# Patient Record
Sex: Male | Born: 1985 | Race: Black or African American | Hispanic: No | Marital: Single | Smoking: Current some day smoker
Health system: Southern US, Community
[De-identification: ages and names within clinical notes are randomized; demographics above are authoritative.]

## PROBLEM LIST (undated history)

## (undated) DIAGNOSIS — T4145XA Adverse effect of unspecified anesthetic, initial encounter: Secondary | ICD-10-CM

## (undated) DIAGNOSIS — T8859XA Other complications of anesthesia, initial encounter: Secondary | ICD-10-CM

## (undated) DIAGNOSIS — D573 Sickle-cell trait: Secondary | ICD-10-CM

---
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
## 2012-04-07 ENCOUNTER — Emergency Department (INDEPENDENT_AMBULATORY_CARE_PROVIDER_SITE_OTHER)
Admission: EM | Admit: 2012-04-07 | Discharge: 2012-04-07 | Disposition: A | Discharge status: Home / Self Care | Payer: Self-pay | Source: Home / Self Care | Attending: Family Medicine | Admitting: Family Medicine

## 2012-04-07 ENCOUNTER — Encounter (HOSPITAL_COMMUNITY): Payer: Self-pay | Admitting: Emergency Medicine

## 2012-04-07 DIAGNOSIS — Z2089 Contact with and (suspected) exposure to other communicable diseases: Secondary | ICD-10-CM

## 2012-04-07 DIAGNOSIS — Z202 Contact with and (suspected) exposure to infections with a predominantly sexual mode of transmission: Secondary | ICD-10-CM

## 2012-04-07 MED ORDER — METRONIDAZOLE 250 MG PO TABS: 250.0000 mg | ORAL_TABLET | Freq: Three times a day (TID) | ORAL | Status: AC

## 2012-04-07 NOTE — ED Provider Notes (Signed)
History     CSN: [id number]  Arrival date & time 04/07/12  1604   First MD Initiated Contact with Patient 04/07/12 1622      Chief Complaint  Patient presents with  . Exposure to STD    (Consider location/radiation/quality/duration/timing/severity/associated sxs/prior treatment) Patient is a 26 y.o. male presenting with STD exposure. The history is provided by the patient.  Exposure to STD This is a new problem. The current episode started yesterday (girlfriend found yest that she has trichomonas, pt with no sx.). The problem has not changed since onset.The symptoms are aggravated by nothing.    History reviewed. No pertinent past medical history.  History reviewed. No pertinent past surgical history.  No family history on file.  History  Substance Use Topics  . Smoking status: Current Everyday Smoker  . Smokeless tobacco: Not on file  . Alcohol Use: Yes      Review of Systems  Constitutional: Negative.   Gastrointestinal: Negative.   Genitourinary: Negative.   Skin: Negative for rash.    Allergies  Review of patient's allergies indicates no known allergies.  Home Medications   Current Outpatient Rx  Name Route Sig Dispense Refill  . METRONIDAZOLE 250 MG PO TABS Oral Take 1 tablet (250 mg total) by mouth 3 (three) times daily. 21 tablet 0    There were no vitals taken for this visit.  Physical Exam  Nursing note and vitals reviewed. Constitutional: He is oriented to person, place, and time. He appears well-developed and well-nourished.  Abdominal: Soft. Bowel sounds are normal.  Genitourinary: Penis normal. No penile tenderness.  Neurological: He is alert and oriented to person, place, and time.  Skin: Skin is warm and dry.    ED Course  Procedures (including critical care time)   Labs Reviewed  GC/CHLAMYDIA PROBE AMP, GENITAL   No results found.   1. Exposure to STD       MDM          Linna Hoff, MD 04/07/12 1723

## 2012-04-07 NOTE — Discharge Instructions (Signed)
Use medicine as prescribed, we will call if tests show a need for other treatment.  

## 2012-04-07 NOTE — ED Notes (Signed)
PT HERE FOR TREATMENT OF TRICHOMONAS AFTER GIRLFRIEND POSITIVE YESTERDAY.PT DENIES PENILE D/C OR PAIN.

## 2012-05-05 ENCOUNTER — Encounter (HOSPITAL_COMMUNITY): Payer: Self-pay | Admitting: Emergency Medicine

## 2012-05-05 ENCOUNTER — Emergency Department (HOSPITAL_COMMUNITY)
Admission: EM | Admit: 2012-05-05 | Discharge: 2012-05-05 | Disposition: A | Discharge status: Home / Self Care | Payer: Self-pay | Attending: Emergency Medicine | Admitting: Emergency Medicine

## 2012-05-05 DIAGNOSIS — L0291 Cutaneous abscess, unspecified: Secondary | ICD-10-CM

## 2012-05-05 DIAGNOSIS — L723 Sebaceous cyst: Secondary | ICD-10-CM | POA: Insufficient documentation

## 2012-05-05 DIAGNOSIS — L03211 Cellulitis of face: Secondary | ICD-10-CM | POA: Insufficient documentation

## 2012-05-05 DIAGNOSIS — L0201 Cutaneous abscess of face: Secondary | ICD-10-CM | POA: Insufficient documentation

## 2012-05-05 DIAGNOSIS — F17200 Nicotine dependence, unspecified, uncomplicated: Secondary | ICD-10-CM | POA: Insufficient documentation

## 2012-05-05 NOTE — ED Notes (Signed)
Pt c/o abscess to right side of head with some swelling around right eye x 5 days

## 2012-05-05 NOTE — Discharge Instructions (Signed)
Abscess An abscess (boil or furuncle) is an infected area under your skin. This area is filled with yellowish white fluid (pus). HOME CARE   Only take medicine as told by your doctor.   Keep the skin clean around your abscess. Keep clothes that may touch the abscess clean.   Change any bandages (dressings) as told by your doctor.   Avoid direct skin contact with other people. The infection can spread by skin contact with others.   Practice good hygiene and do not share personal care items.   Do not share athletic equipment, towels, or whirlpools. Shower after every practice or work out session.   If a draining area cannot be covered:   Do not play sports.   Children should not go to daycare until the wound has healed or until fluid (drainage) stops coming out of the wound.   See your doctor for a follow-up visit as told.  GET HELP RIGHT AWAY IF:   There is more pain, puffiness (swelling), and redness in the wound site.   There is fluid or bleeding from the wound site.   You have muscle aches, chills, fever, or feel sick.   You or your child has a temperature by mouth above 102 F (38.9 C), not controlled by medicine.   Your baby is older than 3 months with a rectal temperature of 102 F (38.9 C) or higher.  MAKE SURE YOU:   Understand these instructions.   Will watch your condition.   Will get help right away if you are not doing well or get worse.  Document Released: 05/14/2008 Document Revised: 11/15/2011 Document Reviewed: 05/14/2008 Exit[employer] Patient Information 2012 [employer]. 

## 2012-05-05 NOTE — ED Provider Notes (Addendum)
History   This chart was scribed for Gwyneth Sprout, MD by Brooks Sailors. The patient was seen in room STRE6/STRE6. Patient's care was started at 1344.   CSN: [id number]  Arrival date & time 05/05/12  1344   First MD Initiated Contact with Patient 05/05/12 1413      Chief Complaint  Patient presents with  . Abscess    (Consider location/radiation/quality/duration/timing/severity/associated sxs/prior treatment) HPI  [name] is a 26 y.o. male who presents to the Emergency Department complaining of a bump on the right ear which is tender and has swelling that goes around the right hear and near the eye. Denies eye pain and ear pain. Symptoms have been persistent for 5 days. Denies neck pain, jaw pain. History provided by patient and patients mother   History reviewed. No pertinent past medical history.  History reviewed. No pertinent past surgical history.  History reviewed. No pertinent family history.  History  Substance Use Topics  . Smoking status: Current Everyday Smoker  . Smokeless tobacco: Not on file  . Alcohol Use: Yes      Review of Systems  All other systems reviewed and are negative.    Allergies  Review of patient's allergies indicates no known allergies.  Home Medications  No current outpatient prescriptions on file.  BP 106/67  Pulse 69  Temp(Src) 98.4 F (36.9 C) (Oral)  Resp 20  SpO2 99%  Physical Exam  Nursing note and vitals reviewed. Constitutional: He is oriented to person, place, and time. He appears well-developed and well-nourished. No distress.  HENT:  Head: Normocephalic and atraumatic.  Left Ear: External ear normal.       Sebaceous pore right anterior to his right ear. Expresses some purulent material, Quarter sized fluctuant, swollen area over the area of his right TMJ. Mild edema under the right eye. Normal jaw movement normal eye function.  Eyes: EOM are normal.  Neck: Neck supple. No tracheal deviation present.    Abdominal: Soft.  Musculoskeletal: Normal range of motion. He exhibits no tenderness.  Neurological: He is alert and oriented to person, place, and time.  Skin: Skin is warm and dry.  Psychiatric: He has a normal mood and affect. His behavior is normal.    ED Course  Procedures (including critical care time)  Pt seen at 1435.   Labs Reviewed - No data to display No results found.  INCISION AND DRAINAGE Performed by: Gwyneth Sprout Consent: Verbal consent obtained. Risks and benefits: risks, benefits and alternatives were discussed Type: abscess  Body area: right face  Anesthesia: local infiltration  Local anesthetic: lidocaine 1% with epinephrine  Anesthetic total: 3 ml  Complexity: complex Blunt dissection to break up loculations  Drainage: purulent  Drainage amount: large amount of purulent foul smelling drainage  Packing material: none  Patient tolerance: Patient tolerated the procedure well with no immediate complications.    1. Abscess   2. Sebaceous cyst       MDM   Pt with sebaceous abscess of the side of the right face in the hair line.  Large amt of purulent drainage removed and pt d/ced home.  NO signs of cellulitis.      I personally performed the services described in this documentation, which was scribed in my presence.  The recorded information has been reviewed and considered.    Gwyneth Sprout, MD 05/05/12 1541  Gwyneth Sprout, MD 05/05/12 8295

## 2012-05-19 ENCOUNTER — Emergency Department (INDEPENDENT_AMBULATORY_CARE_PROVIDER_SITE_OTHER)
Admission: EM | Admit: 2012-05-19 | Discharge: 2012-05-19 | Disposition: A | Discharge status: Home / Self Care | Payer: Self-pay | Source: Home / Self Care | Attending: Family Medicine | Admitting: Family Medicine

## 2012-05-19 ENCOUNTER — Encounter (HOSPITAL_COMMUNITY): Payer: Self-pay | Admitting: *Deleted

## 2012-05-19 DIAGNOSIS — Z202 Contact with and (suspected) exposure to infections with a predominantly sexual mode of transmission: Secondary | ICD-10-CM

## 2012-05-19 DIAGNOSIS — Z2089 Contact with and (suspected) exposure to other communicable diseases: Secondary | ICD-10-CM

## 2012-05-19 MED ORDER — METRONIDAZOLE 500 MG PO TABS: 500.0000 mg | ORAL_TABLET | Freq: Two times a day (BID) | ORAL | Status: DC

## 2012-05-19 MED ORDER — METRONIDAZOLE 500 MG PO TABS: 500.0000 mg | ORAL_TABLET | Freq: Two times a day (BID) | ORAL | Status: AC

## 2012-05-19 NOTE — ED Provider Notes (Signed)
History     CSN: [id number]  Arrival date & time 05/19/12  1628   First MD Initiated Contact with Patient 05/19/12 1814      Chief Complaint  Patient presents with  . Follow-up    (Consider location/radiation/quality/duration/timing/severity/associated sxs/prior treatment) Patient is a 26 y.o. male presenting with penile discharge. The history is provided by the patient. No language interpreter was used.  Penile Discharge This is a new problem.  Pt reports girlfriend was diagnosed with trich.   Pt sent here for evaluation and treatment.  Past Medical History  Diagnosis Date  . STD (male)     History reviewed. No pertinent past surgical history.  History reviewed. No pertinent family history.  History  Substance Use Topics  . Smoking status: Current Everyday Smoker  . Smokeless tobacco: Not on file  . Alcohol Use: Yes     denies      Review of Systems  Genitourinary: Positive for discharge.  All other systems reviewed and are negative.    Allergies  Review of patient's allergies indicates no known allergies.  Home Medications   Current Outpatient Rx  Name Route Sig Dispense Refill  . METRONIDAZOLE 500 MG PO TABS Oral Take 1 tablet (500 mg total) by mouth 2 (two) times daily. 14 tablet 0    BP 117/74  Pulse 70  Temp(Src) 97.7 F (36.5 C) (Oral)  Resp 18  SpO2 99%  Physical Exam  Nursing note and vitals reviewed. Constitutional: He is oriented to person, place, and time. He appears well-developed and well-nourished.  HENT:  Head: Normocephalic.  Eyes: Conjunctivae and EOM are normal. Pupils are equal, round, and reactive to light.  Neck: Normal range of motion.  Cardiovascular: Normal rate and regular rhythm.   Pulmonary/Chest: He has wheezes.  Abdominal: Soft.  Genitourinary:       Hypospadis, clear discharge  Musculoskeletal: Normal range of motion.  Neurological: He is alert and oriented to person, place, and time. He has normal reflexes.    Skin: Skin is warm.  Psychiatric: He has a normal mood and affect.    ED Course  Procedures (including critical care time)  Labs Reviewed - No data to display No results found.   1. Trichomonas contact       MDM  [place]          Elson Areas, [place] 05/19/12 1828

## 2012-05-19 NOTE — Discharge Instructions (Signed)
Trichomoniasis  Trichomoniasis is an infection, caused by the Trichomonas organism, that affects both women and men. In women, the outer male genitalia and the vagina are affected. In men, the penis is mainly affected, but the prostate and other reproductive organs can also be involved. Trichomoniasis is a sexually transmitted disease (STD) and is most often passed to another person through sexual contact. The majority of people who get trichomoniasis do so from a sexual encounter and are also at risk for other STDs.  CAUSES    Sexual intercourse with an infected partner.   It can be present in swimming pools or hot tubs.  SYMPTOMS    Abnormal gray-green frothy vaginal discharge in women.   Vaginal itching and irritation in women.   Itching and irritation of the area outside the vagina in women.   Penile discharge with or without pain in males.   Inflammation of the urethra (urethritis), causing painful urination.   Bleeding after sexual intercourse.  RELATED COMPLICATIONS   Pelvic inflammatory disease.   Infection of the uterus (endometritis).   Infertility.   Tubal (ectopic) pregnancy.   It can be associated with other STDs, including gonorrhea and chlamydia, hepatitis B, and HIV.  COMPLICATIONS DURING PREGNANCY   Early (premature) delivery.   Premature rupture of the membranes (PROM).   Low birth weight.  DIAGNOSIS    Visualization of Trichomonas under the microscope from the vagina discharge.   Ph of the vagina greater than 4.5, tested with a test tape.   Trich Rapid Test.   Culture of the organism, but this is not usually needed.   It may be found on a Pap test.   Having a "strawberry cervix,"which means the cervix looks very red like a strawberry.  TREATMENT    You may be given medication to fight the infection. Inform your caregiver if you could be or are pregnant. Some medications used to treat the infection should not be taken during pregnancy.   Over-the-counter medications or  creams to decrease itching or irritation may be recommended.   Your sexual partner will need to be treated if infected.  HOME CARE INSTRUCTIONS    Take all medication prescribed by your caregiver.   Take over-the-counter medication for itching or irritation as directed by your caregiver.   Do not have sexual intercourse while you have the infection.   Do not douche or wear tampons.   Discuss your infection with your partner, as your partner may have acquired the infection from you. Or, your partner may have been the person who transmitted the infection to you.   Have your sex partner examined and treated if necessary.   Practice safe, informed, and protected sex.   See your caregiver for other STD testing.  SEEK MEDICAL CARE IF:    You still have symptoms after you finish the medication.   You have an oral temperature above 102 F (38.9 C).   You develop belly (abdominal) pain.   You have pain when you urinate.   You have bleeding after sexual intercourse.   You develop a rash.   The medication makes you sick or makes you throw up (vomit).  Document Released: 05/22/2001 Document Revised: 11/15/2011 Document Reviewed: 06/17/2009  [employer] Patient Information 2012 [employer].

## 2012-05-19 NOTE — ED Notes (Signed)
Pt reports being seen here previously for std and treated. Significant other reports that they had unprotected sex and that she is having "discharge". She also reports that she has not completed antibiotics for treatment of std but does have prescription. Pt and significant other educated on prevention, cause and treatment of disease process.

## 2012-05-20 LAB — GC/CHLAMYDIA PROBE AMP, GENITAL
Chlamydia, DNA Probe: NEGATIVE
GC Probe Amp, Genital: NEGATIVE

## 2012-05-21 NOTE — ED Provider Notes (Signed)
Medical screening examination/treatment/procedure(s) were performed by non-physician practitioner and as supervising physician I was immediately available for consultation/collaboration.   MORENO-COLL,Codi Folkerts; MD   Maya Arcand Moreno-Coll, MD 05/21/12 0732 

## 2012-12-27 ENCOUNTER — Encounter (HOSPITAL_COMMUNITY): Payer: Self-pay | Admitting: Emergency Medicine

## 2012-12-27 ENCOUNTER — Emergency Department (HOSPITAL_COMMUNITY)
Admission: EM | Admit: 2012-12-27 | Discharge: 2012-12-27 | Disposition: A | Discharge status: Home / Self Care | Payer: No Typology Code available for payment source | Attending: Emergency Medicine | Admitting: Emergency Medicine

## 2012-12-27 ENCOUNTER — Emergency Department (HOSPITAL_COMMUNITY): Payer: No Typology Code available for payment source

## 2012-12-27 DIAGNOSIS — IMO0001 Reserved for inherently not codable concepts without codable children: Secondary | ICD-10-CM | POA: Insufficient documentation

## 2012-12-27 DIAGNOSIS — H1130 Conjunctival hemorrhage, unspecified eye: Secondary | ICD-10-CM | POA: Insufficient documentation

## 2012-12-27 DIAGNOSIS — F17200 Nicotine dependence, unspecified, uncomplicated: Secondary | ICD-10-CM | POA: Insufficient documentation

## 2012-12-27 DIAGNOSIS — IMO0002 Reserved for concepts with insufficient information to code with codable children: Secondary | ICD-10-CM | POA: Insufficient documentation

## 2012-12-27 DIAGNOSIS — S0003XA Contusion of scalp, initial encounter: Secondary | ICD-10-CM | POA: Insufficient documentation

## 2012-12-27 DIAGNOSIS — H5710 Ocular pain, unspecified eye: Secondary | ICD-10-CM | POA: Insufficient documentation

## 2012-12-27 DIAGNOSIS — Z79899 Other long term (current) drug therapy: Secondary | ICD-10-CM | POA: Insufficient documentation

## 2012-12-27 DIAGNOSIS — Y9389 Activity, other specified: Secondary | ICD-10-CM | POA: Insufficient documentation

## 2012-12-27 DIAGNOSIS — S1093XA Contusion of unspecified part of neck, initial encounter: Secondary | ICD-10-CM | POA: Insufficient documentation

## 2012-12-27 DIAGNOSIS — H539 Unspecified visual disturbance: Secondary | ICD-10-CM | POA: Insufficient documentation

## 2012-12-27 DIAGNOSIS — S0993XA Unspecified injury of face, initial encounter: Secondary | ICD-10-CM | POA: Insufficient documentation

## 2012-12-27 DIAGNOSIS — T148XXA Other injury of unspecified body region, initial encounter: Secondary | ICD-10-CM

## 2012-12-27 DIAGNOSIS — Y92410 Unspecified street and highway as the place of occurrence of the external cause: Secondary | ICD-10-CM | POA: Insufficient documentation

## 2012-12-27 DIAGNOSIS — Z8619 Personal history of other infectious and parasitic diseases: Secondary | ICD-10-CM | POA: Insufficient documentation

## 2012-12-27 MED ORDER — METHOCARBAMOL 500 MG PO TABS: 500.0000 mg | ORAL_TABLET | Freq: Two times a day (BID) | ORAL | Status: DC

## 2012-12-27 MED ORDER — OXYCODONE-ACETAMINOPHEN 5-325 MG PO TABS
2.0000 | ORAL_TABLET | Freq: Once | ORAL | Status: AC
  Administered 2012-12-27: 2 via ORAL
  Filled 2012-12-27: qty 2

## 2012-12-27 MED ORDER — HYDROCODONE-ACETAMINOPHEN 5-325 MG PO TABS: 2.0000 | ORAL_TABLET | ORAL | Status: DC | PRN

## 2012-12-27 MED ORDER — NAPROXEN 500 MG PO TABS: 500.0000 mg | ORAL_TABLET | Freq: Two times a day (BID) | ORAL | Status: DC

## 2012-12-27 NOTE — ED Notes (Signed)
Pt leaving ED with family. Pt given d/c teaching and prescriptions. Pt educated on importance of follow up with opthalmology as soon as possible. Pt verbalizes understanding and has no further questions upon d/c. Pt does not appear to be in acute distress upon d/c.
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
## 2012-12-27 NOTE — ED Notes (Signed)
Ticket to ride given to pt and plan explained to pt and pts family.

## 2012-12-27 NOTE — ED Notes (Signed)
Hannah M, PA at bedside  

## 2012-12-27 NOTE — ED Notes (Signed)
[employer] at pts bedside speaking with pt

## 2012-12-27 NOTE — ED Notes (Signed)
Pts family states pt tried to open eye and started watering more on attempt to open left eye. Family asked if pt would need an eye patch. Informed family and pt that would speak with EDPA about possible eye patch.

## 2012-12-27 NOTE — ED Notes (Signed)
Cyndia Skeeters, PA states eye patch will not work. Cyndia Skeeters. PA states to do a saline flush on left eye.
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
## 2012-12-27 NOTE — ED Notes (Signed)
Pt able to open eye better after irrigation and able to blink. Pt states eye feels better after irrigation. Pt tolerated procedure well.

## 2012-12-28 NOTE — ED Provider Notes (Signed)
Medical screening examination/treatment/procedure(s) were performed by non-physician practitioner and as supervising physician I was immediately available for consultation/collaboration.   Carleene Cooper III, MD 12/28/12 1322

## 2013-09-04 ENCOUNTER — Encounter (HOSPITAL_COMMUNITY): Payer: Self-pay | Admitting: *Deleted

## 2013-09-04 ENCOUNTER — Emergency Department (HOSPITAL_COMMUNITY): Payer: Worker's Compensation

## 2013-09-04 ENCOUNTER — Inpatient Hospital Stay (HOSPITAL_COMMUNITY)
Admission: EM | Admit: 2013-09-04 | Discharge: 2013-09-10 | DRG: 908 | Disposition: A | Discharge status: Home Health | Payer: Worker's Compensation | Attending: Orthopaedic Surgery | Admitting: Orthopaedic Surgery

## 2013-09-04 ENCOUNTER — Encounter (HOSPITAL_COMMUNITY): Payer: No Typology Code available for payment source | Admitting: Certified Registered Nurse Anesthetist

## 2013-09-04 ENCOUNTER — Encounter (HOSPITAL_COMMUNITY)
Admission: EM | Disposition: A | Discharge status: Home Health | Payer: Self-pay | Source: Home / Self Care | Attending: Orthopaedic Surgery

## 2013-09-04 ENCOUNTER — Encounter (HOSPITAL_COMMUNITY): Payer: Self-pay | Admitting: Certified Registered Nurse Anesthetist

## 2013-09-04 DIAGNOSIS — IMO0002 Reserved for concepts with insufficient information to code with codable children: Secondary | ICD-10-CM

## 2013-09-04 DIAGNOSIS — S92302A Fracture of unspecified metatarsal bone(s), left foot, initial encounter for closed fracture: Secondary | ICD-10-CM

## 2013-09-04 DIAGNOSIS — S91109A Unspecified open wound of unspecified toe(s) without damage to nail, initial encounter: Secondary | ICD-10-CM | POA: Diagnosis present

## 2013-09-04 DIAGNOSIS — Y9269 Other specified industrial and construction area as the place of occurrence of the external cause: Secondary | ICD-10-CM

## 2013-09-04 DIAGNOSIS — S9782XA Crushing injury of left foot, initial encounter: Secondary | ICD-10-CM

## 2013-09-04 DIAGNOSIS — S91312A Laceration without foreign body, left foot, initial encounter: Secondary | ICD-10-CM

## 2013-09-04 DIAGNOSIS — S93326A Dislocation of tarsometatarsal joint of unspecified foot, initial encounter: Secondary | ICD-10-CM

## 2013-09-04 DIAGNOSIS — S92309B Fracture of unspecified metatarsal bone(s), unspecified foot, initial encounter for open fracture: Secondary | ICD-10-CM

## 2013-09-04 DIAGNOSIS — S93105A Unspecified dislocation of left toe(s), initial encounter: Secondary | ICD-10-CM

## 2013-09-04 DIAGNOSIS — F17200 Nicotine dependence, unspecified, uncomplicated: Secondary | ICD-10-CM | POA: Diagnosis present

## 2013-09-04 DIAGNOSIS — W240XXA Contact with lifting devices, not elsewhere classified, initial encounter: Secondary | ICD-10-CM | POA: Diagnosis present

## 2013-09-04 DIAGNOSIS — S9780XA Crushing injury of unspecified foot, initial encounter: Principal | ICD-10-CM | POA: Diagnosis present

## 2013-09-04 DIAGNOSIS — S91319A Laceration without foreign body, unspecified foot, initial encounter: Secondary | ICD-10-CM

## 2013-09-04 DIAGNOSIS — S91102A Unspecified open wound of left great toe without damage to nail, initial encounter: Secondary | ICD-10-CM

## 2013-09-04 DIAGNOSIS — S97102A Crushing injury of unspecified left toe(s), initial encounter: Secondary | ICD-10-CM

## 2013-09-04 HISTORY — PX: PERCUTANEOUS PINNING: SHX2209

## 2013-09-04 HISTORY — PX: I & D EXTREMITY: SHX5045

## 2013-09-04 LAB — CBC WITH DIFFERENTIAL/PLATELET
Basophils Relative: 0 % (ref 0–1)
HCT: 44.1 % (ref 39.0–52.0)
Hemoglobin: 15.1 g/dL (ref 13.0–17.0)
Lymphocytes Relative: 40 % (ref 12–46)
Lymphs Abs: 2.9 10*3/uL (ref 0.7–4.0)
MCHC: 34.2 g/dL (ref 30.0–36.0)
Monocytes Absolute: 0.6 10*3/uL (ref 0.1–1.0)
Monocytes Relative: 8 % (ref 3–12)
Neutro Abs: 3.4 10*3/uL (ref 1.7–7.7)
Neutrophils Relative %: 48 % (ref 43–77)
RBC: 5.35 MIL/uL (ref 4.22–5.81)
WBC: 7.1 10*3/uL (ref 4.0–10.5)

## 2013-09-04 LAB — BASIC METABOLIC PANEL
BUN: 16 mg/dL (ref 6–23)
CO2: 26 mEq/L (ref 19–32)
Chloride: 104 mEq/L (ref 96–112)
Creatinine, Ser: 1.1 mg/dL (ref 0.50–1.35)
GFR calc Af Amer: >90 mL/min (ref >90)
Glucose, Bld: 155 mg/dL — ABNORMAL HIGH (ref 70–99)
Potassium: 3.4 mEq/L — ABNORMAL LOW (ref 3.5–5.1)

## 2013-09-04 SURGERY — IRRIGATION AND DEBRIDEMENT EXTREMITY
Anesthesia: General | Site: Foot | Laterality: Left | Wound class: Dirty or Infected

## 2013-09-04 MED ORDER — ONDANSETRON HCL 4 MG/2ML IJ SOLN
INTRAMUSCULAR | Status: DC | PRN
  Administered 2013-09-04: 4 mg via INTRAVENOUS

## 2013-09-04 MED ORDER — DEXAMETHASONE SODIUM PHOSPHATE 4 MG/ML IJ SOLN
INTRAMUSCULAR | Status: DC | PRN
  Administered 2013-09-04: 8 mg via INTRAVENOUS

## 2013-09-04 MED ORDER — BISACODYL 10 MG RE SUPP
10.0000 mg | Freq: Every day | RECTAL | Status: DC | PRN
  Administered 2013-09-06 – 2013-09-10 (×2): 10 mg via RECTAL
  Filled 2013-09-04 (×2): qty 1

## 2013-09-04 MED ORDER — DIPHENHYDRAMINE HCL 12.5 MG/5ML PO ELIX
12.5000 mg | ORAL_SOLUTION | ORAL | Status: DC | PRN
  Administered 2013-09-05 – 2013-09-07 (×2): 25 mg via ORAL
  Filled 2013-09-04 (×2): qty 10

## 2013-09-04 MED ORDER — FENTANYL CITRATE 0.05 MG/ML IJ SOLN
INTRAMUSCULAR | Status: DC | PRN
Start: 2013-09-04 — End: 2013-09-04
  Administered 2013-09-04: 50 ug via INTRAVENOUS
  Administered 2013-09-04: 100 ug via INTRAVENOUS

## 2013-09-04 MED ORDER — HYDROMORPHONE HCL PF 1 MG/ML IJ SOLN: 0.2500 mg | INTRAMUSCULAR | Status: DC | PRN

## 2013-09-04 MED ORDER — OXYCODONE-ACETAMINOPHEN 5-325 MG PO TABS
1.0000 | ORAL_TABLET | ORAL | Status: DC | PRN
  Administered 2013-09-04 – 2013-09-06 (×4): 2 via ORAL
  Administered 2013-09-06: 1 via ORAL
  Administered 2013-09-06 – 2013-09-10 (×19): 2 via ORAL
  Filled 2013-09-04 (×4): qty 2
  Filled 2013-09-04: qty 1
  Filled 2013-09-04 (×15): qty 2
  Filled 2013-09-04: qty 1
  Filled 2013-09-04 (×6): qty 2

## 2013-09-04 MED ORDER — HYDROMORPHONE HCL PF 1 MG/ML IJ SOLN
1.0000 mg | INTRAMUSCULAR | Status: DC | PRN
  Administered 2013-09-04: 1 mg via INTRAVENOUS
  Filled 2013-09-04: qty 1

## 2013-09-04 MED ORDER — LACTATED RINGERS IV SOLN
INTRAVENOUS | Status: DC | PRN
  Administered 2013-09-04: 11:00:00 via INTRAVENOUS

## 2013-09-04 MED ORDER — CEFAZOLIN SODIUM-DEXTROSE 2-3 GM-% IV SOLR
INTRAVENOUS | Status: AC
  Filled 2013-09-04: qty 50

## 2013-09-04 MED ORDER — ONDANSETRON HCL 4 MG/2ML IJ SOLN: 4.0000 mg | Freq: Four times a day (QID) | INTRAMUSCULAR | Status: DC | PRN

## 2013-09-04 MED ORDER — OXYCODONE HCL 5 MG/5ML PO SOLN: 5.0000 mg | Freq: Once | ORAL | Status: DC | PRN

## 2013-09-04 MED ORDER — CEFAZOLIN SODIUM-DEXTROSE 2-3 GM-% IV SOLR
2.0000 g | Freq: Once | INTRAVENOUS | Status: AC
  Administered 2013-09-04: 2 g via INTRAVENOUS

## 2013-09-04 MED ORDER — DOCUSATE SODIUM 100 MG PO CAPS
100.0000 mg | ORAL_CAPSULE | Freq: Two times a day (BID) | ORAL | Status: DC
  Administered 2013-09-04 – 2013-09-09 (×11): 100 mg via ORAL
  Filled 2013-09-04 (×14): qty 1

## 2013-09-04 MED ORDER — KCL IN DEXTROSE-NACL 20-5-0.45 MEQ/L-%-% IV SOLN
INTRAVENOUS | Status: DC
  Administered 2013-09-04 – 2013-09-08 (×2): via INTRAVENOUS
  Filled 2013-09-04 (×14): qty 1000

## 2013-09-04 MED ORDER — SENNOSIDES-DOCUSATE SODIUM 8.6-50 MG PO TABS
1.0000 | ORAL_TABLET | Freq: Every evening | ORAL | Status: DC | PRN
  Administered 2013-09-10: 1 via ORAL
  Filled 2013-09-04: qty 1

## 2013-09-04 MED ORDER — SODIUM CHLORIDE 0.9 % IR SOLN
Status: DC | PRN
  Administered 2013-09-04 (×2): 1000 mL

## 2013-09-04 MED ORDER — METOCLOPRAMIDE HCL 5 MG/ML IJ SOLN: 5.0000 mg | Freq: Three times a day (TID) | INTRAMUSCULAR | Status: DC | PRN

## 2013-09-04 MED ORDER — PROPOFOL 10 MG/ML IV BOLUS
INTRAVENOUS | Status: DC | PRN
  Administered 2013-09-04: 200 mg via INTRAVENOUS

## 2013-09-04 MED ORDER — CEFAZOLIN SODIUM 1-5 GM-% IV SOLN
1.0000 g | Freq: Four times a day (QID) | INTRAVENOUS | Status: AC
  Administered 2013-09-04 – 2013-09-05 (×3): 1 g via INTRAVENOUS
  Filled 2013-09-04 (×3): qty 50

## 2013-09-04 MED ORDER — ZOLPIDEM TARTRATE 5 MG PO TABS: 5.0000 mg | ORAL_TABLET | Freq: Every evening | ORAL | Status: DC | PRN

## 2013-09-04 MED ORDER — ONDANSETRON HCL 4 MG PO TABS: 4.0000 mg | ORAL_TABLET | Freq: Four times a day (QID) | ORAL | Status: DC | PRN

## 2013-09-04 MED ORDER — METOCLOPRAMIDE HCL 5 MG PO TABS
5.0000 mg | ORAL_TABLET | Freq: Three times a day (TID) | ORAL | Status: DC | PRN
  Filled 2013-09-04: qty 2

## 2013-09-04 MED ORDER — ARTIFICIAL TEARS OP OINT
TOPICAL_OINTMENT | OPHTHALMIC | Status: DC | PRN
  Administered 2013-09-04: 1 via OPHTHALMIC

## 2013-09-04 MED ORDER — FLEET ENEMA 7-19 GM/118ML RE ENEM: 1.0000 | ENEMA | Freq: Once | RECTAL | Status: AC | PRN

## 2013-09-04 MED ORDER — TETANUS-DIPHTH-ACELL PERTUSSIS 5-2.5-18.5 LF-MCG/0.5 IM SUSP
0.5000 mL | Freq: Once | INTRAMUSCULAR | Status: AC
  Administered 2013-09-04: 0.5 mL via INTRAMUSCULAR
  Filled 2013-09-04: qty 0.5

## 2013-09-04 MED ORDER — PROMETHAZINE HCL 25 MG/ML IJ SOLN: 6.2500 mg | INTRAMUSCULAR | Status: DC | PRN

## 2013-09-04 MED ORDER — HYDROMORPHONE HCL PF 1 MG/ML IJ SOLN
0.5000 mg | INTRAMUSCULAR | Status: DC | PRN
  Administered 2013-09-04 – 2013-09-10 (×27): 1 mg via INTRAVENOUS
  Filled 2013-09-04 (×28): qty 1

## 2013-09-04 MED ORDER — METHOCARBAMOL 500 MG PO TABS
500.0000 mg | ORAL_TABLET | Freq: Four times a day (QID) | ORAL | Status: DC | PRN
  Administered 2013-09-04 – 2013-09-10 (×13): 500 mg via ORAL
  Filled 2013-09-04 (×14): qty 1

## 2013-09-04 MED ORDER — LIDOCAINE HCL (CARDIAC) 20 MG/ML IV SOLN
INTRAVENOUS | Status: DC | PRN
  Administered 2013-09-04: 65 mg via INTRAVENOUS

## 2013-09-04 MED ORDER — HYDROCODONE-ACETAMINOPHEN 5-325 MG PO TABS
1.0000 | ORAL_TABLET | ORAL | Status: DC | PRN
  Administered 2013-09-05 – 2013-09-08 (×6): 2 via ORAL
  Filled 2013-09-04 (×5): qty 2

## 2013-09-04 MED ORDER — CEFAZOLIN SODIUM 1-5 GM-% IV SOLN
1.0000 g | Freq: Once | INTRAVENOUS | Status: AC
  Administered 2013-09-04: 1 g via INTRAVENOUS
  Filled 2013-09-04: qty 50

## 2013-09-04 MED ORDER — HYDROMORPHONE HCL PF 1 MG/ML IJ SOLN
1.0000 mg | Freq: Once | INTRAMUSCULAR | Status: AC
  Administered 2013-09-04: 1 mg via INTRAVENOUS
  Filled 2013-09-04: qty 1

## 2013-09-04 MED ORDER — OXYCODONE HCL 5 MG PO TABS: 5.0000 mg | ORAL_TABLET | Freq: Once | ORAL | Status: DC | PRN

## 2013-09-04 MED ORDER — METHOCARBAMOL 100 MG/ML IJ SOLN
500.0000 mg | Freq: Four times a day (QID) | INTRAMUSCULAR | Status: DC | PRN
  Filled 2013-09-04: qty 5

## 2013-09-04 MED ORDER — MORPHINE SULFATE 2 MG/ML IJ SOLN
2.0000 mg | Freq: Once | INTRAMUSCULAR | Status: AC
  Administered 2013-09-04: 2 mg via INTRAVENOUS
  Filled 2013-09-04: qty 1

## 2013-09-04 SURGICAL SUPPLY — 66 items
APL SKNCLS STERI-STRIP NONHPOA (GAUZE/BANDAGES/DRESSINGS)
BAG DECANTER FOR FLEXI CONT (MISCELLANEOUS) ×2 IMPLANT
BANDAGE ELASTIC 3 VELCRO ST LF (GAUZE/BANDAGES/DRESSINGS) ×2 IMPLANT
BANDAGE ELASTIC 4 VELCRO ST LF (GAUZE/BANDAGES/DRESSINGS) ×2 IMPLANT
BANDAGE ELASTIC 6 VELCRO ST LF (GAUZE/BANDAGES/DRESSINGS) ×1 IMPLANT
BANDAGE GAUZE ELAST BULKY 4 IN (GAUZE/BANDAGES/DRESSINGS) ×2 IMPLANT
BENZOIN TINCTURE PRP APPL 2/3 (GAUZE/BANDAGES/DRESSINGS) IMPLANT
BLADE SURG ROTATE 9660 (MISCELLANEOUS) IMPLANT
BNDG COHESIVE 4X5 TAN STRL (GAUZE/BANDAGES/DRESSINGS) ×2 IMPLANT
CLOTH BEACON ORANGE TIMEOUT ST (SAFETY) ×2 IMPLANT
COTTON STERILE ROLL (GAUZE/BANDAGES/DRESSINGS) ×1 IMPLANT
COVER SURGICAL LIGHT HANDLE (MISCELLANEOUS) ×2 IMPLANT
CUFF TOURNIQUET SINGLE 18IN (TOURNIQUET CUFF) ×2 IMPLANT
CUFF TOURNIQUET SINGLE 24IN (TOURNIQUET CUFF) IMPLANT
DRSG EMULSION OIL 3X3 NADH (GAUZE/BANDAGES/DRESSINGS) ×2 IMPLANT
DRSG PAD ABDOMINAL 8X10 ST (GAUZE/BANDAGES/DRESSINGS) ×3 IMPLANT
DURAPREP 26ML APPLICATOR (WOUND CARE) ×2 IMPLANT
ELECT REM PT RETURN 9FT ADLT (ELECTROSURGICAL)
ELECTRODE REM PT RTRN 9FT ADLT (ELECTROSURGICAL) IMPLANT
GAUZE XEROFORM 1X8 LF (GAUZE/BANDAGES/DRESSINGS) ×1 IMPLANT
GAUZE XEROFORM 5X9 LF (GAUZE/BANDAGES/DRESSINGS) ×2 IMPLANT
GLOVE BIO SURGEON STRL SZ7 (GLOVE) ×1 IMPLANT
GLOVE BIO SURGEON STRL SZ8.5 (GLOVE) ×1 IMPLANT
GLOVE BIOGEL PI IND STRL 7.5 (GLOVE) ×1 IMPLANT
GLOVE BIOGEL PI IND STRL 8 (GLOVE) ×1 IMPLANT
GLOVE BIOGEL PI INDICATOR 7.5 (GLOVE) ×1
GLOVE BIOGEL PI INDICATOR 8 (GLOVE) ×2
GLOVE ECLIPSE 7.0 STRL STRAW (GLOVE) ×2 IMPLANT
GLOVE ORTHO TXT STRL SZ7.5 (GLOVE) ×2 IMPLANT
GLOVE SURG SS PI 8.0 STRL IVOR (GLOVE) ×1 IMPLANT
GLOVE SURG SS PI 8.5 STRL IVOR (GLOVE) ×1
GLOVE SURG SS PI 8.5 STRL STRW (GLOVE) IMPLANT
GOWN PREVENTION PLUS LG XLONG (DISPOSABLE) ×1 IMPLANT
GOWN PREVENTION PLUS XLARGE (GOWN DISPOSABLE) ×2 IMPLANT
GOWN STRL NON-REIN LRG LVL3 (GOWN DISPOSABLE) ×3 IMPLANT
GOWN STRL REIN 2XL LVL4 (GOWN DISPOSABLE) ×1 IMPLANT
GOWN STRL REIN 2XL XLG LVL4 (GOWN DISPOSABLE) ×2 IMPLANT
HANDPIECE INTERPULSE COAX TIP (DISPOSABLE)
KIT BASIN OR (CUSTOM PROCEDURE TRAY) ×2 IMPLANT
KIT ROOM TURNOVER OR (KITS) ×2 IMPLANT
MANIFOLD NEPTUNE II (INSTRUMENTS) ×2 IMPLANT
NS IRRIG 1000ML POUR BTL (IV SOLUTION) ×2 IMPLANT
PACK ORTHO EXTREMITY (CUSTOM PROCEDURE TRAY) ×2 IMPLANT
PAD ARMBOARD 7.5X6 YLW CONV (MISCELLANEOUS) ×4 IMPLANT
PAD CAST 4YDX4 CTTN HI CHSV (CAST SUPPLIES) IMPLANT
PADDING CAST COTTON 4X4 STRL (CAST SUPPLIES) ×2
PADDING CAST COTTON 6X4 STRL (CAST SUPPLIES) ×1 IMPLANT
SET HNDPC FAN SPRY TIP SCT (DISPOSABLE) IMPLANT
SPONGE GAUZE 4X4 12PLY (GAUZE/BANDAGES/DRESSINGS) ×2 IMPLANT
SPONGE LAP 18X18 X RAY DECT (DISPOSABLE) ×2 IMPLANT
SPONGE LAP 4X18 X RAY DECT (DISPOSABLE) ×2 IMPLANT
STOCKINETTE IMPERVIOUS 9X36 MD (GAUZE/BANDAGES/DRESSINGS) ×2 IMPLANT
STRIP CLOSURE SKIN 1/2X4 (GAUZE/BANDAGES/DRESSINGS) IMPLANT
SUT ETHILON 3 0 PS 1 (SUTURE) ×3 IMPLANT
SUT ETHILON 4 0 P 3 18 (SUTURE) IMPLANT
SUT ETHILON 4 0 PS 2 18 (SUTURE) ×4 IMPLANT
SUT ETHILON 5 0 P 3 18 (SUTURE)
SUT NYLON ETHILON 5-0 P-3 1X18 (SUTURE) IMPLANT
SUT PROLENE 4 0 P 3 18 (SUTURE) IMPLANT
TOWEL OR 17X24 6PK STRL BLUE (TOWEL DISPOSABLE) ×2 IMPLANT
TOWEL OR 17X26 10 PK STRL BLUE (TOWEL DISPOSABLE) ×2 IMPLANT
TUBE ANAEROBIC SPECIMEN COL (MISCELLANEOUS) IMPLANT
TUBE CONNECTING 12X1/4 (SUCTIONS) ×2 IMPLANT
UNDERPAD 30X30 INCONTINENT (UNDERPADS AND DIAPERS) ×2 IMPLANT
WATER STERILE IRR 1000ML POUR (IV SOLUTION) ×2 IMPLANT
YANKAUER SUCT BULB TIP NO VENT (SUCTIONS) ×2 IMPLANT

## 2013-09-04 NOTE — ED Notes (Signed)
Patient completely undressed and in gown only. Family at bedside and has all personal belongings. Patient transferred to OR holding #[id number] via stretcher by tech. Report phoned to [place], [place] in [place] holding.

## 2013-09-04 NOTE — Anesthesia Postprocedure Evaluation (Signed)
  Anesthesia Post-op Note  Patient: [name]  Procedure(s) Performed: Procedure(s): IRRIGATION AND DEBRIDEMENT EXTREMITY (Left) PERCUTANEOUS PINNING  LEFT FOOT (Left)  Patient Location: PACU  Anesthesia Type:General  Level of Consciousness: awake and alert   Airway and Oxygen Therapy: Patient Spontanous Breathing  Post-op Pain: mild  Post-op Assessment: Post-op Vital signs reviewed  Post-op Vital Signs: stable  Complications: No apparent anesthesia complications

## 2013-09-04 NOTE — Anesthesia Preprocedure Evaluation (Signed)
Anesthesia Evaluation  Patient identified by MRN, date of birth, ID band  History of Anesthesia Complications Negative for: history of anesthetic complications  Airway Mallampati: I  Neck ROM: Full    Dental  (+) Teeth Intact   Pulmonary Current Smoker,  breath sounds clear to auscultation        Cardiovascular negative cardio ROS  Rate:Normal     Neuro/Psych negative neurological ROS     GI/Hepatic negative GI ROS, Neg liver ROS,   Endo/Other  negative endocrine ROS  Renal/GU negative Renal ROS     Musculoskeletal negative musculoskeletal ROS (+)   Abdominal   Peds  Hematology negative hematology ROS (+)   Anesthesia Other Findings   Reproductive/Obstetrics                           Anesthesia Physical Anesthesia Plan  ASA: I and emergent  Anesthesia Plan: General   Post-op Pain Management:    Induction: Intravenous  Airway Management Planned: Oral ETT  Additional Equipment:   Intra-op Plan:   Post-operative Plan: Extubation in OR  Informed Consent: I have reviewed the patients History and Physical, chart, labs and discussed the procedure including the risks, benefits and alternatives for the proposed anesthesia with the patient or authorized representative who has indicated his/her understanding and acceptance.   Dental advisory given  Plan Discussed with: CRNA and Surgeon  Anesthesia Plan Comments:         Anesthesia Quick Evaluation
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
## 2013-09-04 NOTE — ED Provider Notes (Signed)
Patient turned over to me by Dr. Norlene Campbell.    26 who had a crush injury to his left foot just prior to arrivalat wor khe was seen and evaluated by Dr. Norlene Campbell. He has no other health problems. He has an open fracture of the left great toe with multiple meta tarsal fractures. He has been n.p.o. here. He has received Ancef and his tetanus status is up-to-date. I have spoken with Dr. Ophelia Charter on-call for orthopedic surgery and he will see and attend to the patient in the emergency department with probable transfer to the operating room.    I have spoken with the patient and his mother and they are aware of the plan.    Hilario Quarry, MD 09/04/13 [phone]

## 2013-09-04 NOTE — ED Provider Notes (Signed)
CSN: [id number]     Arrival date & time 09/04/13  0532 History   First MD Initiated Contact with Patient 09/04/13 0541     Chief Complaint  Patient presents with  . Foot Injury   (Consider location/radiation/quality/duration/timing/severity/associated sxs/prior Treatment) HPI 27 year old male presents to emergency department via EMS from work with left foot injury.  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  Patient with severe pain to distal foot, swelling, and bleeding.  Firefighters wrapped foot in gauze.  Patient does not know when his last tetanus shot was.  He received 250 mcg of fentanyl en route.  Past Medical History  Diagnosis Date  . STD (male)    History reviewed. No pertinent past surgical history. History reviewed. No pertinent family history. History  Substance Use Topics  . Smoking status: Current Every Day Smoker  . Smokeless tobacco: Not on file  . Alcohol Use: Yes     Comment: denies    Review of Systems  All other systems reviewed and are negative.   See History of Present Illness; otherwise all other systems are reviewed and negative  Allergies  Review of patient's allergies indicates no known allergies.  Home Medications  No current outpatient prescriptions on file. BP 116/87  Pulse 69  Temp(Src) 98.3 F (36.8 C) (Oral)  Resp 16  Ht 5\' 7"  (1.702 m)  Wt 165 lb (74.844 kg)  BMI 25.84 kg/m2  SpO2 97% Physical Exam  Constitutional: He is oriented to person, place, and time. He appears well-developed and well-nourished. He appears distressed.  HENT:  Head: Normocephalic and atraumatic.  Nose: Nose normal.  Mouth/Throat: Oropharynx is clear and moist.  Eyes: Conjunctivae and EOM are normal. Pupils are equal, round, and reactive to light.  Neck: Normal range of motion. Neck supple. No JVD present. No tracheal deviation present. No thyromegaly present.  Cardiovascular: Normal rate, regular rhythm,  normal heart sounds and intact distal pulses.  Exam reveals no gallop and no friction rub.   No murmur heard. Pulmonary/Chest: Effort normal and breath sounds normal. No stridor. No respiratory distress. He has no wheezes. He has no rales. He exhibits no tenderness.  Abdominal: Soft. Bowel sounds are normal. He exhibits no distension and no mass. There is no tenderness. There is no rebound and no guarding.  Musculoskeletal: He exhibits tenderness.  Crush injury to left foot noted.  Patient has a 3 x 4 area to medial foot with skin sloughing.  Patient has laceration approximately 3 cm, between first and second toe that appears deep.  He has a second laceration about 2 cm between the third and fourth toe and a third laceration about 1 cm between the fourth and fifth toe.  Bleeding is controlled.  Patient has decreased sensation to the distal medial foot and great toe.  Cap refill is 3-5 seconds in great toe, rest of foot, is normal.  Patient unable to flex or bend the great toe  Lymphadenopathy:    He has no cervical adenopathy.  Neurological: He is alert and oriented to person, place, and time.  Skin: Skin is warm and dry. No rash noted. No erythema. No pallor.    ED Course  Procedures (including critical care time) Labs Review Labs Reviewed  BASIC METABOLIC PANEL - Abnormal; Notable for the following:    Potassium 3.4 (*)    Glucose, Bld 155 (*)    All other components within normal limits  CBC WITH  DIFFERENTIAL   Imaging Review Dg Foot Complete Left  09/04/2013   CLINICAL DATA:  Crush injury.  Left foot pain.  EXAM: LEFT FOOT - COMPLETE 3+ VIEW  COMPARISON:  None.  FINDINGS: The patient has a transverse fracture through the proximal diaphysis of the 1st metatarsal with 1 shaft with lateral displacement and approximately 1.8 cm of foreshortening. There is also fracture through the distal diaphysis of the 3rd metatarsal with approximately 15 degrees of medial angulation. Nondisplaced  comminuted fracture through the diaphysis of the 5th metatarsal is also identified.  Also seen is a fracture through the head of the middle phalanx of the little toe on the medial side. The DIP joint of the great toe is markedly widened at 0.7 cm with gas present between the phalanges. There is a subtle linear density between the bases of the 2nd and 3rd metatarsals which could be due to a fracture although the donor site is not visualized.  IMPRESSION: Fractures of the 1st, 3rd and 5th metatarsals as described.  Separation of the IP joint of the great toe with distraction of 0.7 cm.  Fracture of the head of the middle phalanx of the little toe.  Small linear density between the bases of the 2nd and 3rd metatarsals may be due to fracture although the donor site is not visible on this study.   Electronically Signed   By: Drusilla Kanner M.D.   On: 09/04/2013 06:32    MDM   1. Open dislocation of great toe of left foot, initial encounter   2. Multiple closed fractures of metatarsal bone, left, initial encounter   3. Crush injury of left foot, initial encounter   4. Laceration of foot, left, initial encounter   5. Skin tear    27 year old male with crush injury to left foot.  Suspect open fracture to left great toe.  Will update tetanus, give Ancef IV.  We'll give pain medicine.  Plan for x-rays. 6:42 AM Per xrays, pt with open dislocation of great toe, fracture of 1st, 3rd and 5th metatarsals, middle phalanx of 5th toe, and possible fx fragment at 2nd/3rd metatarsals.  Will consult orthopedics.    Olivia Mackie, MD 09/04/13 [phone]

## 2013-09-04 NOTE — ED Notes (Addendum)
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 Pt given of fentanyl and 4mg  of zofran in route.  Pt has lacerations between all of his toes with the great toe being the largest. Pt has a skin tear on the arch of his foot. Pt has localized numbness to his great toe and great manipulation is possible with the great toe. Pt has clean dressing applied, blood controlled but saturating bandage.

## 2013-09-04 NOTE — Interval H&P Note (Signed)
History and Physical Interval Note:  09/04/2013 11:21 AM  [name]  has presented today for surgery, with the diagnosis of left foot crush injury  The various methods of treatment have been discussed with the patient and family. After consideration of risks, benefits and other options for treatment, the patient has consented to  Procedure(s): IRRIGATION AND DEBRIDEMENT EXTREMITY (Left) PERCUTANEOUS PINNING  LEFT FOOT (Left) as a surgical intervention .  The patient's history has been reviewed, patient examined, no change in status, stable for surgery.  I have reviewed the patient's chart and labs.  Questions were answered to the patient's satisfaction.     YATES,MARK C

## 2013-09-04 NOTE — Transfer of Care (Signed)
Immediate Anesthesia Transfer of Care Note  Patient: [name]  Procedure(s) Performed: Procedure(s): IRRIGATION AND DEBRIDEMENT EXTREMITY (Left) PERCUTANEOUS PINNING  LEFT FOOT (Left)  Patient Location: PACU  Anesthesia Type:General  Level of Consciousness: awake, alert , oriented and patient cooperative  Airway & Oxygen Therapy: Patient Spontanous Breathing  Post-op Assessment: Report given to PACU RN, Post -op Vital signs reviewed and stable and Patient moving all extremities X 4  Post vital signs: Reviewed and stable  Complications: No apparent anesthesia complications

## 2013-09-04 NOTE — Preoperative (Signed)
Beta Blockers   Reason not to administer Beta Blockers:Not Applicable 

## 2013-09-04 NOTE — Brief Op Note (Cosign Needed)
09/04/2013  12:18 PM  PATIENT:  [name]  27 y.o. male  PRE-OPERATIVE DIAGNOSIS:  left foot crush injury  POST-OPERATIVE DIAGNOSIS:  left foot crush injury. Open 2nd, 3rd,and 5th metatarsal fractures. Open IP  joint dislocation great toe, open first metatarsal shaft fracture. Rupture FHL and EHL of great toe, Lis franc instability.  Lacerations web spaces 1-2 , 3-4, 4-5  PROCEDURE:  Procedure(s): IRRIGATION AND DEBRIDEMENT EXTREMITY (Left) PERCUTANEOUS PINNING  LEFT FOOT (Left)  SURGEON:  Surgeon(s) and Role:    * Eldred Manges, MD - Primary  PHYSICIAN ASSISTANT: Maud Deed PAC  ASSISTANTS: none   ANESTHESIA:   general  EBL:  Total I/O In: -  Out: 375 [Urine:375]  BLOOD ADMINISTERED:none  DRAINS: none   LOCAL MEDICATIONS USED:  NONE  SPECIMEN:  No Specimen  DISPOSITION OF SPECIMEN:  N/A  COUNTS:  YES  TOURNIQUET:   Total Tourniquet Time Documented: Thigh (Left) - -93590 minutes Thigh (Left) - 28 minutes Total: Thigh (Left) - -93561 minutes   DICTATION: .Note written in EPIC  PLAN OF CARE: Admit to inpatient   PATIENT DISPOSITION:  PACU - hemodynamically stable.   Delay start of Pharmacological VTE agent (>24hrs) due to surgical blood loss or risk of bleeding: no

## 2013-09-05 NOTE — Progress Notes (Signed)
Subjective: 1 Day Post-Op Procedure(s) (LRB): IRRIGATION AND DEBRIDEMENT EXTREMITY (Left) PERCUTANEOUS PINNING  LEFT FOOT (Left) Patient reports pain as 7 on 0-10 scale.    Objective: Vital signs in last 24 hours: Temp:  [97.2 F (36.2 C)-99.5 F (37.5 C)] 99 F (37.2 C) (09/27 0602) Pulse Rate:  [70-96] 70 (09/27 0602) Resp:  [18-25] 18 (09/27 0602) BP: (117-144)/(62-87) 120/62 mmHg (09/27 0602) SpO2:  [96 %-98 %] 97 % (09/27 0602)  Intake/Output from previous day: 09/26 0701 - 09/27 0700 In: 1418.8 [I.V.:1368.8; IV Piggyback:50] Out: 1125 [Urine:1075; Blood:50] Intake/Output this shift: Total I/O In: 360 [P.O.:360] Out: -    Recent Labs  09/04/13 0545  HGB 15.1    Recent Labs  09/04/13 0545  WBC 7.1  RBC 5.35  HCT 44.1  PLT 196    Recent Labs  09/04/13 0545  NA 141  K 3.4*  CL 104  CO2 26  BUN 16  CREATININE 1.10  GLUCOSE 155*  CALCIUM 9.0   No results found for this basename: LABPT, INR,  in the last 72 hours  dressing intact. no sensation to great toe. crush injury with dislocation. open fractures.    Assessment/Plan: 1 Day Post-Op Procedure(s) (LRB): IRRIGATION AND DEBRIDEMENT EXTREMITY (Left) PERCUTANEOUS PINNING  LEFT FOOT (Left) Plan:    CT foot today to look at [facility]  .   OR again on Monday for repeat irrigation and ORIF of first metatarsal if skin is acceptable and swelling is down.   Brennyn Haisley C 09/05/2013, 11:25 AM

## 2013-09-05 NOTE — Op Note (Signed)
[name], [name] NO.:  [id number]  MEDICAL RECORD NO.:  [id number]  LOCATION:  [id number]                        FACILITY:  [facility]  PHYSICIAN:  [name], M.D.    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  Left second, third, and fifth metatarsal fractures.  Opened first metatarsal shaft fracture. Open first interphalangeal joint dislocation (great toe) with rupture of both extensor and flexor to the great toe.  Lisfranc instability. Lacerations third toe webspace and 4th and 5th toe webspace.  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  Left second, third, and fifth metatarsal fractures.  Opened first metatarsal shaft fracture. Open first interphalangeal joint dislocation (great toe) with rupture of both extensor and flexor to the great toe.  Lisfranc instability. Lacerations third toe webspace and 4th and 5th toe webspace.  PROCEDURE: 1. Irrigation and debridement of skin, subcutaneous tissue, tendon,     muscle and bone associated with open fracture, first metatarsal,     shaft, left. 2. Excisional debridement of skin, reduction and pinning of the great     toe IP joint dislocation.  Closer lacerations third and fourth toe     webspace and fourth and fifth toe webspace.  Reduction and pinning,     first metatarsal fracture.  SURGEON:  [name], M.D.  ASSISTANT:  Mike Deed, PA-C, medically necessary and present for the entire procedure.  TOURNIQUET TIME:  Less than 30 minutes.  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  Extensor crush injury, extensive swelling, and this occurred around 530.  I was contacted at about 8:30.  The patient had evidence of injuries with open great toe injury, extremely displaced transverse midshaft first metatarsal fracture.  Fractures of the third metatarsal neck and a comminuted fracture of the fifth metatarsal.  He also had a fracture of one of the toes of the small toe involving the middle phalanx head without significant displacement.  ANESTHESIA:  General.  PROCEDURE IN DETAIL:  After induction of general anesthesia, standard prepping and draping with Betadine prep, Betadine solution, proximal thigh tourniquet.  Tourniquet was deflated.  The second dose of Ancef was given prophylactically.  Usual extremity sheets, drapes, and stockinette was applied.  Skin and subcutaneous tissue, some devitalized muscle and bone was debrided from the left first metatarsal transverse midshaft fracture delivering the bony edges debriding with rongeur, resection of some subcutaneous tissue as well as excising 1 mm along the skin edges and extending the open fracture line proximally for exposure. There was minimal contamination, and the patient reportedly had no blood in his shoe, but had bleeding in the sock.  There was a laceration, 1 cm, medial over the metatarsal, first.  Extensor tendon was visualized at this level.  IP  joint of the great toe was distracted 1 cm with floppy and was unstable and was wildly consistent with rupture both extensor and flexor to the great toe.  There was a laceration of the medial aspect of the web space that extended to the IP joint.  Copious irrigation was used for all fracture sites and the webspace laceration between the fourth and fifth toe and also the third and fourth toe. Between the fourth and fifth, there were several transverse lacerations, 3 lacerations of 2 cm with each, and 2 cm laceration between third toe and fourth toe.   These were copiously irrigated and then closed with 3-0 nylon sutures.  Floppy IP joint of the great toe was then grasped, and pin was started distally several millimeters below the nail and ran up through the phalanx.  Holding the IP joint in satisfactory position, the IP joint of the great toe was pinned, and then it was extended across the metatarsal head, a few millimeters correcting the severe varus deformity.  The metatarsal shaft was then reduced using Homans towel clip.  Once it was induced, pin was drilled across from medial to lateral across the first and second metatarsal, at least the latter to keep it relatively out the length.  There was comminution of the fracture.  Repeat irrigation was performed, and then skin was reapproximated in both the great toe and second toe webspace as well as the medial first metatarsal fracture area.  With palpation, there was instability of the Lisfranc joint second metatarsal.  No definite fracture was seen, but there was widening of the gap between the proximal portion of the second metatarsal and the first cuneiform.  Careful inspection of the remaining portions of the toe and feet were performed to make sure there was no additional lacerations.  Xeroform, 4 x 4's, Webril, and large cotton roll was applied with Ace wrap for bulky compressive postop dressing. The patient tolerated the procedure well and will return to the OR, likely Lisfranc repair with CT pending.  Repair of the capsule of the great toe IP joint and extensor and flexor repair as needed, and after repairing the first metatarsal fracture.     [name], M.D.     MCY/MEDQ  D:  09/04/2013  T:  09/05/2013  Job:  [id number]

## 2013-09-06 ENCOUNTER — Inpatient Hospital Stay (HOSPITAL_COMMUNITY): Payer: Worker's Compensation

## 2013-09-06 MED ORDER — POLYETHYLENE GLYCOL 3350 17 G PO PACK
17.0000 g | PACK | Freq: Every day | ORAL | Status: DC
  Administered 2013-09-06 – 2013-09-09 (×3): 17 g via ORAL
  Filled 2013-09-06 (×5): qty 1

## 2013-09-06 NOTE — Progress Notes (Signed)
Subjective: 2 Days Post-Op Procedure(s) (LRB): IRRIGATION AND DEBRIDEMENT EXTREMITY (Left) PERCUTANEOUS PINNING  LEFT FOOT (Left) Patient reports pain as moderate.    Objective: Vital signs in last 24 hours: Temp:  [98.8 F (37.1 C)-99.3 F (37.4 C)] 98.8 F (37.1 C) (09/28 0547) Pulse Rate:  [70-79] 71 (09/28 0547) Resp:  [18] 18 (09/28 0547) BP: (106-113)/(60-65) 113/62 mmHg (09/28 0547) SpO2:  [94 %-98 %] 98 % (09/28 0547)  Intake/Output from previous day: 09/27 0701 - 09/28 0700 In: 1080 [P.O.:1080] Out: 900 [Urine:900] Intake/Output this shift: Total I/O In: 240 [P.O.:240] Out: 250 [Urine:250]   Recent Labs  09/04/13 0545  HGB 15.1    Recent Labs  09/04/13 0545  WBC 7.1  RBC 5.35  HCT 44.1  PLT 196    Recent Labs  09/04/13 0545  NA 141  K 3.4*  CL 104  CO2 26  BUN 16  CREATININE 1.10  GLUCOSE 155*  CALCIUM 9.0   No results found for this basename: LABPT, INR,  in the last 72 hours  getting some sensation back to great toe light touch.  CT scan shows Lisfranc joint is reduced.    Assessment/Plan: 2 Days Post-Op Procedure(s) (LRB): IRRIGATION AND DEBRIDEMENT EXTREMITY (Left) PERCUTANEOUS PINNING  LEFT FOOT (Left) Plan:    Return to OR tomorrow as planned for plating left 1st MT shaft fx and repair of great toe tendons/capsule as needed. Discussed with pt he agrees to proceed.   [name] C 09/06/2013, 12:37 PM

## 2013-09-07 ENCOUNTER — Encounter (HOSPITAL_COMMUNITY)
Admission: EM | Disposition: A | Discharge status: Home Health | Payer: Self-pay | Source: Home / Self Care | Attending: Orthopaedic Surgery

## 2013-09-07 ENCOUNTER — Encounter (HOSPITAL_COMMUNITY): Payer: Self-pay | Admitting: Anesthesiology

## 2013-09-07 ENCOUNTER — Inpatient Hospital Stay (HOSPITAL_COMMUNITY): Payer: Worker's Compensation | Admitting: Anesthesiology

## 2013-09-07 ENCOUNTER — Inpatient Hospital Stay (HOSPITAL_COMMUNITY): Payer: Worker's Compensation

## 2013-09-07 HISTORY — PX: I & D EXTREMITY: SHX5045

## 2013-09-07 HISTORY — PX: ORIF TOE FRACTURE: SHX5032

## 2013-09-07 LAB — SURGICAL PCR SCREEN: MRSA, PCR: NEGATIVE

## 2013-09-07 SURGERY — IRRIGATION AND DEBRIDEMENT EXTREMITY
Anesthesia: General | Site: Foot | Laterality: Left | Wound class: Clean Contaminated

## 2013-09-07 MED ORDER — SODIUM CHLORIDE 0.9 % IR SOLN
Status: DC | PRN
  Administered 2013-09-07: 1000 mL

## 2013-09-07 MED ORDER — ONDANSETRON HCL 4 MG/2ML IJ SOLN: 4.0000 mg | Freq: Four times a day (QID) | INTRAMUSCULAR | Status: DC | PRN

## 2013-09-07 MED ORDER — CEFAZOLIN SODIUM 1-5 GM-% IV SOLN
1.0000 g | Freq: Three times a day (TID) | INTRAVENOUS | Status: DC
  Administered 2013-09-07 – 2013-09-10 (×8): 1 g via INTRAVENOUS
  Filled 2013-09-07 (×10): qty 50

## 2013-09-07 MED ORDER — PROPOFOL 10 MG/ML IV BOLUS
INTRAVENOUS | Status: DC | PRN
  Administered 2013-09-07: 200 mg via INTRAVENOUS
  Administered 2013-09-07: 50 mg via INTRAVENOUS

## 2013-09-07 MED ORDER — CEFAZOLIN SODIUM-DEXTROSE 2-3 GM-% IV SOLR
INTRAVENOUS | Status: AC
  Filled 2013-09-07: qty 50

## 2013-09-07 MED ORDER — FENTANYL CITRATE 0.05 MG/ML IJ SOLN
INTRAMUSCULAR | Status: DC | PRN
  Administered 2013-09-07 (×5): 50 ug via INTRAVENOUS

## 2013-09-07 MED ORDER — MIDAZOLAM HCL 5 MG/5ML IJ SOLN
INTRAMUSCULAR | Status: DC | PRN
  Administered 2013-09-07: 1 mg via INTRAVENOUS

## 2013-09-07 MED ORDER — ONDANSETRON HCL 4 MG/2ML IJ SOLN
INTRAMUSCULAR | Status: DC | PRN
  Administered 2013-09-07: 4 mg via INTRAVENOUS

## 2013-09-07 MED ORDER — LACTATED RINGERS IV SOLN
INTRAVENOUS | Status: DC | PRN
  Administered 2013-09-07 (×2): via INTRAVENOUS

## 2013-09-07 MED ORDER — LIDOCAINE HCL (CARDIAC) 20 MG/ML IV SOLN
INTRAVENOUS | Status: DC | PRN
  Administered 2013-09-07: 50 mg via INTRAVENOUS

## 2013-09-07 MED ORDER — CEFAZOLIN SODIUM-DEXTROSE 2-3 GM-% IV SOLR: 2.0000 g | Freq: Once | INTRAVENOUS | Status: DC

## 2013-09-07 MED ORDER — HYDROMORPHONE HCL PF 1 MG/ML IJ SOLN: 0.2500 mg | INTRAMUSCULAR | Status: DC | PRN

## 2013-09-07 MED ORDER — LACTATED RINGERS IV SOLN
Freq: Once | INTRAVENOUS | Status: AC
  Administered 2013-09-07: 15:00:00 via INTRAVENOUS

## 2013-09-07 MED ORDER — OXYCODONE HCL 5 MG PO TABS: 5.0000 mg | ORAL_TABLET | Freq: Once | ORAL | Status: DC | PRN

## 2013-09-07 MED ORDER — OXYCODONE HCL 5 MG/5ML PO SOLN: 5.0000 mg | Freq: Once | ORAL | Status: DC | PRN

## 2013-09-07 SURGICAL SUPPLY — 96 items
APL SKNCLS STERI-STRIP NONHPOA (GAUZE/BANDAGES/DRESSINGS)
BAG DECANTER FOR FLEXI CONT (MISCELLANEOUS) ×1 IMPLANT
BANDAGE CONFORM 3  STR LF (GAUZE/BANDAGES/DRESSINGS) IMPLANT
BANDAGE ELASTIC 4 VELCRO ST LF (GAUZE/BANDAGES/DRESSINGS) ×1 IMPLANT
BANDAGE ELASTIC 6 VELCRO ST LF (GAUZE/BANDAGES/DRESSINGS) IMPLANT
BANDAGE ESMARK 6X9 LF (GAUZE/BANDAGES/DRESSINGS) ×1 IMPLANT
BANDAGE GAUZE ELAST BULKY 4 IN (GAUZE/BANDAGES/DRESSINGS) ×1 IMPLANT
BENZOIN TINCTURE PRP APPL 2/3 (GAUZE/BANDAGES/DRESSINGS) ×1 IMPLANT
BIT DRILL QC 1.8X125 (Sternal Fixation) ×2 IMPLANT
BLADE SURG ROTATE 9660 (MISCELLANEOUS) IMPLANT
BNDG CMPR 9X6 STRL LF SNTH (GAUZE/BANDAGES/DRESSINGS) ×2
BNDG CMPR MD 5X2 ELC HKLP STRL (GAUZE/BANDAGES/DRESSINGS)
BNDG COHESIVE 4X5 TAN STRL (GAUZE/BANDAGES/DRESSINGS) ×1 IMPLANT
BNDG COHESIVE 6X5 TAN STRL LF (GAUZE/BANDAGES/DRESSINGS) ×2 IMPLANT
BNDG ELASTIC 2 VLCR STRL LF (GAUZE/BANDAGES/DRESSINGS) IMPLANT
BNDG ESMARK 6X9 LF (GAUZE/BANDAGES/DRESSINGS) ×3
CANISTER SUCTION 2500CC (MISCELLANEOUS) ×2 IMPLANT
CAP PIN ORTHO PINK (CAP) ×2 IMPLANT
CLOTH BEACON ORANGE TIMEOUT ST (SAFETY) ×1 IMPLANT
CORDS BIPOLAR (ELECTRODE) ×1 IMPLANT
COTTON STERILE ROLL (GAUZE/BANDAGES/DRESSINGS) ×2 IMPLANT
COVER MAYO STAND STRL (DRAPES) ×1 IMPLANT
COVER SURGICAL LIGHT HANDLE (MISCELLANEOUS) ×3 IMPLANT
CUFF TOURNIQUET SINGLE 18IN (TOURNIQUET CUFF) ×1 IMPLANT
CUFF TOURNIQUET SINGLE 24IN (TOURNIQUET CUFF) IMPLANT
CUFF TOURNIQUET SINGLE 34IN LL (TOURNIQUET CUFF) ×2 IMPLANT
CUFF TOURNIQUET SINGLE 44IN (TOURNIQUET CUFF) IMPLANT
DRAPE C-ARM 42X72 X-RAY (DRAPES) ×2 IMPLANT
DRAPE INCISE IOBAN 66X45 STRL (DRAPES) ×1 IMPLANT
DRAPE OEC MINIVIEW 54X84 (DRAPES) ×1 IMPLANT
DRAPE PROXIMA HALF (DRAPES) ×1 IMPLANT
DRAPE U-SHAPE 47X51 STRL (DRAPES) ×3 IMPLANT
DRSG EMULSION OIL 3X3 NADH (GAUZE/BANDAGES/DRESSINGS) ×1 IMPLANT
DRSG PAD ABDOMINAL 8X10 ST (GAUZE/BANDAGES/DRESSINGS) ×6 IMPLANT
DURAPREP 26ML APPLICATOR (WOUND CARE) ×1 IMPLANT
ELECT REM PT RETURN 9FT ADLT (ELECTROSURGICAL) ×3
ELECTRODE REM PT RTRN 9FT ADLT (ELECTROSURGICAL) ×2 IMPLANT
GAUZE XEROFORM 1X8 LF (GAUZE/BANDAGES/DRESSINGS) IMPLANT
GAUZE XEROFORM 5X9 LF (GAUZE/BANDAGES/DRESSINGS) ×3 IMPLANT
GLOVE BIOGEL PI IND STRL 6.5 (GLOVE) ×1 IMPLANT
GLOVE BIOGEL PI IND STRL 7.0 (GLOVE) ×1 IMPLANT
GLOVE BIOGEL PI IND STRL 7.5 (GLOVE) ×3 IMPLANT
GLOVE BIOGEL PI IND STRL 8 (GLOVE) ×2 IMPLANT
GLOVE BIOGEL PI INDICATOR 6.5 (GLOVE) ×1
GLOVE BIOGEL PI INDICATOR 7.0 (GLOVE) ×1
GLOVE BIOGEL PI INDICATOR 7.5 (GLOVE) ×2
GLOVE BIOGEL PI INDICATOR 8 (GLOVE) ×1
GLOVE ECLIPSE 7.0 STRL STRAW (GLOVE) ×3 IMPLANT
GLOVE ORTHO TXT STRL SZ7.5 (GLOVE) ×3 IMPLANT
GLOVE SURG SS PI 6.0 STRL IVOR (GLOVE) ×2 IMPLANT
GLOVE SURG SS PI 7.0 STRL IVOR (GLOVE) ×2 IMPLANT
GOWN PREVENTION PLUS LG XLONG (DISPOSABLE) ×2 IMPLANT
GOWN PREVENTION PLUS XLARGE (GOWN DISPOSABLE) ×1 IMPLANT
GOWN SRG XL XLNG 56XLVL 4 (GOWN DISPOSABLE) ×1 IMPLANT
GOWN STRL NON-REIN LRG LVL3 (GOWN DISPOSABLE) ×4 IMPLANT
GOWN STRL NON-REIN XL XLG LVL4 (GOWN DISPOSABLE) ×3
HANDPIECE INTERPULSE COAX TIP (DISPOSABLE)
K-WIRE PLA 9 .062 (WIRE) ×4 IMPLANT
KIT BASIN OR (CUSTOM PROCEDURE TRAY) ×3 IMPLANT
KIT ROOM TURNOVER OR (KITS) ×3 IMPLANT
KWIRE SGL PT/SMTH 9X45IN (WIRE) ×2 IMPLANT
MANIFOLD NEPTUNE II (INSTRUMENTS) ×1 IMPLANT
NS IRRIG 1000ML POUR BTL (IV SOLUTION) ×3 IMPLANT
PACK ORTHO EXTREMITY (CUSTOM PROCEDURE TRAY) ×3 IMPLANT
PAD ARMBOARD 7.5X6 YLW CONV (MISCELLANEOUS) ×4 IMPLANT
PAD CAST 4YDX4 CTTN HI CHSV (CAST SUPPLIES) ×1 IMPLANT
PADDING CAST ABS 6INX4YD NS (CAST SUPPLIES) ×1
PADDING CAST ABS COTTON 6X4 NS (CAST SUPPLIES) ×1 IMPLANT
PADDING CAST COTTON 4X4 STRL (CAST SUPPLIES)
PADDING CAST COTTON 6X4 STRL (CAST SUPPLIES) ×3 IMPLANT
PLATE LC CDP 2.4 6H (Plate) ×2 IMPLANT
SCREW CORTEX 2.4 18MM (Screw) ×6 IMPLANT
SCREW CORTEX 2.4 20MM (Screw) ×4 IMPLANT
SET HNDPC FAN SPRY TIP SCT (DISPOSABLE) IMPLANT
SPECIMEN JAR SMALL (MISCELLANEOUS) ×1 IMPLANT
SPONGE GAUZE 4X4 12PLY (GAUZE/BANDAGES/DRESSINGS) ×3 IMPLANT
SPONGE LAP 18X18 X RAY DECT (DISPOSABLE) ×3 IMPLANT
SPONGE LAP 4X18 X RAY DECT (DISPOSABLE) ×1 IMPLANT
STAPLER VISISTAT 35W (STAPLE) IMPLANT
STOCKINETTE IMPERVIOUS 9X36 MD (GAUZE/BANDAGES/DRESSINGS) ×1 IMPLANT
STRIP CLOSURE SKIN 1/2X4 (GAUZE/BANDAGES/DRESSINGS) ×1 IMPLANT
SUCTION FRAZIER TIP 10 FR DISP (SUCTIONS) ×3 IMPLANT
SUT ETHILON 3 0 PS 1 (SUTURE) ×2 IMPLANT
SUT ETHILON 4 0 P 3 18 (SUTURE) IMPLANT
SUT ETHILON 4 0 PS 2 18 (SUTURE) ×4 IMPLANT
SUT ETHILON 5 0 P 3 18 (SUTURE)
SUT NYLON ETHILON 5-0 P-3 1X18 (SUTURE) IMPLANT
SUT VIC AB 2-0 CT1 27 (SUTURE)
SUT VIC AB 2-0 CT1 TAPERPNT 27 (SUTURE) ×2 IMPLANT
TOWEL OR 17X24 6PK STRL BLUE (TOWEL DISPOSABLE) ×3 IMPLANT
TOWEL OR 17X26 10 PK STRL BLUE (TOWEL DISPOSABLE) ×3 IMPLANT
TUBE ANAEROBIC SPECIMEN COL (MISCELLANEOUS) IMPLANT
TUBE CONNECTING 12X1/4 (SUCTIONS) ×3 IMPLANT
UNDERPAD 30X30 INCONTINENT (UNDERPADS AND DIAPERS) ×3 IMPLANT
WATER STERILE IRR 1000ML POUR (IV SOLUTION) ×1 IMPLANT
YANKAUER SUCT BULB TIP NO VENT (SUCTIONS) ×3 IMPLANT

## 2013-09-07 NOTE — Interval H&P Note (Signed)
History and Physical Interval Note:  09/07/2013 3:37 PM  [name]  has presented today for surgery, with the diagnosis of left foot crush injury  The various methods of treatment have been discussed with the patient and family. After consideration of risks, benefits and other options for treatment, the patient has consented to  Procedure(s): IRRIGATION AND DEBRIDEMENT EXTREMITY (Left) OPEN REDUCTION INTERNAL FIXATION (ORIF) FOOT LISFRANC FRACTURE (Left) OPEN REDUCTION INTERNAL FIXATION (ORIF)  FIRST METATARSAL (TOE) FRACTURE (Left) TENDON REPAIR GREAT TOE (Left) as a surgical intervention .  The patient's history has been reviewed, patient examined, no change in status, stable for surgery.  I have reviewed the patient's chart and labs.  Questions were answered to the patient's satisfaction.     Wendell Fiebig C

## 2013-09-07 NOTE — Brief Op Note (Cosign Needed)
09/04/2013 - 09/07/2013  5:12 PM  PATIENT:  [name]  27 y.o. male  PRE-OPERATIVE DIAGNOSIS:  left foot crush injury  POST-OPERATIVE DIAGNOSIS:  left foot crush injury  PROCEDURE:  ORIF left first metatarsal open fracture Pinning of left great toe IP joint dislocation Irrigation and debridement of open fractures.  SURGEON:  Surgeon(s) and Role:    * Eldred Manges, MD - Primary  PHYSICIAN ASSISTANT: Maud Deed [facility]  ASSISTANTS: none   ANESTHESIA:   general  EBL:  Total I/O In: 1000 [I.V.:1000] Out: -   BLOOD ADMINISTERED:none  DRAINS: none   LOCAL MEDICATIONS USED:  NONE  SPECIMEN:  No Specimen  DISPOSITION OF SPECIMEN:  N/A  COUNTS:  YES  TOURNIQUET:   Total Tourniquet Time Documented: Thigh (Left) - 48 minutes Total: Thigh (Left) - 48 minutes   DICTATION: .Note written in EPIC  PLAN OF CARE: Admit to inpatient   PATIENT DISPOSITION:  PACU - hemodynamically stable.   Delay start of Pharmacological VTE agent (>24hrs) due to surgical blood loss or risk of bleeding: no

## 2013-09-07 NOTE — Anesthesia Procedure Notes (Signed)
Procedure Name: LMA Insertion Date/Time: 09/07/2013 4:05 PM Performed by: Coralee Rud Pre-anesthesia Checklist: Patient identified, Emergency Drugs available, Suction available and Patient being monitored Patient Re-evaluated:Patient Re-evaluated prior to inductionOxygen Delivery Method: Circle system utilized Preoxygenation: Pre-oxygenation with 100% oxygen Intubation Type: IV induction Ventilation: Mask ventilation without difficulty LMA: LMA inserted LMA Size: 5.0 Number of attempts: 1 Placement Confirmation: positive ETCO2 and breath sounds checked- equal and bilateral Tube secured with: Tape Dental Injury: Teeth and Oropharynx as per pre-operative assessment

## 2013-09-07 NOTE — Progress Notes (Signed)
09/07/13 Spoke with patient then contacted his employer to get workers comp #. Called Human Resources [phone] ext [phone] and left messages, awaiting call back. Will contact workers comp once I have their #. Will continue to follow. Jacquelynn Cree RN, BSN, CCM

## 2013-09-07 NOTE — Anesthesia Preprocedure Evaluation (Signed)
Anesthesia Evaluation  Patient identified by MRN, date of birth, ID band Patient awake    Reviewed: Allergy & Precautions, H&P , NPO status , Patient's Chart, lab work & pertinent test results  Airway Mallampati: II  Neck ROM: full    Dental   Pulmonary Current Smoker,          Cardiovascular     Neuro/Psych    GI/Hepatic   Endo/Other    Renal/GU      Musculoskeletal   Abdominal   Peds  Hematology   Anesthesia Other Findings   Reproductive/Obstetrics                           Anesthesia Physical Anesthesia Plan  ASA: II  Anesthesia Plan: General   Post-op Pain Management:    Induction: Intravenous  Airway Management Planned: LMA  Additional Equipment:   Intra-op Plan:   Post-operative Plan:   Informed Consent: I have reviewed the patients History and Physical, chart, labs and discussed the procedure including the risks, benefits and alternatives for the proposed anesthesia with the patient or authorized representative who has indicated his/her understanding and acceptance.     Plan Discussed with: CRNA, Anesthesiologist and Surgeon  Anesthesia Plan Comments:         Anesthesia Quick Evaluation  

## 2013-09-07 NOTE — Transfer of Care (Signed)
Immediate Anesthesia Transfer of Care Note  Patient: [name]  Procedure(s) Performed: Procedure(s): IRRIGATION AND DEBRIDEMENT EXTREMITY (Left) OPEN REDUCTION INTERNAL FIXATION (ORIF)  GREAT TOE IP JOINT DISLOCATION, ORIF 1ST MTP JOINT DISLOCATION AND ORIF 1ST METATARSAL SHAFT FRACTURE. (Left)  Patient Location: PACU  Anesthesia Type:General  Level of Consciousness: awake, alert  and pateint uncooperative  Airway & Oxygen Therapy: Patient Spontanous Breathing and Patient connected to face mask oxygen  Post-op Assessment: Report given to PACU RN, Post -op Vital signs reviewed and stable and Patient moving all extremities  Post vital signs: Reviewed and stable  Complications: No apparent anesthesia complications

## 2013-09-07 NOTE — H&P (View-Only) (Signed)
Subjective: 2 Days Post-Op Procedure(s) (LRB): IRRIGATION AND DEBRIDEMENT EXTREMITY (Left) PERCUTANEOUS PINNING  LEFT FOOT (Left) Patient reports pain as moderate.    Objective: Vital signs in last 24 hours: Temp:  [98.8 F (37.1 C)-99.3 F (37.4 C)] 98.8 F (37.1 C) (09/28 0547) Pulse Rate:  [70-79] 71 (09/28 0547) Resp:  [18] 18 (09/28 0547) BP: (106-113)/(60-65) 113/62 mmHg (09/28 0547) SpO2:  [94 %-98 %] 98 % (09/28 0547)  Intake/Output from previous day: 09/27 0701 - 09/28 0700 In: 1080 [P.O.:1080] Out: 900 [Urine:900] Intake/Output this shift: Total I/O In: 240 [P.O.:240] Out: 250 [Urine:250]   Recent Labs  09/04/13 0545  HGB 15.1    Recent Labs  09/04/13 0545  WBC 7.1  RBC 5.35  HCT 44.1  PLT 196    Recent Labs  09/04/13 0545  NA 141  K 3.4*  CL 104  CO2 26  BUN 16  CREATININE 1.10  GLUCOSE 155*  CALCIUM 9.0   No results found for this basename: LABPT, INR,  in the last 72 hours  getting some sensation back to great toe light touch.  CT scan shows Lisfranc joint is reduced.    Assessment/Plan: 2 Days Post-Op Procedure(s) (LRB): IRRIGATION AND DEBRIDEMENT EXTREMITY (Left) PERCUTANEOUS PINNING  LEFT FOOT (Left) Plan:    Return to OR tomorrow as planned for plating left 1st MT shaft fx and repair of great toe tendons/capsule as needed. Discussed with pt he agrees to proceed.   YATES,MARK C 09/06/2013, 12:37 PM  

## 2013-09-07 NOTE — Interval H&P Note (Signed)
History and Physical Interval Note:  09/07/2013 12:05 PM  [name]  has presented today for surgery, with the diagnosis of left foot crush injury  The various methods of treatment have been discussed with the patient and family. After consideration of risks, benefits and other options for treatment, the patient has consented to  Procedure(s): IRRIGATION AND DEBRIDEMENT EXTREMITY (Left) OPEN REDUCTION INTERNAL FIXATION (ORIF) FOOT LISFRANC FRACTURE (Left) OPEN REDUCTION INTERNAL FIXATION (ORIF)  FIRST METATARSAL (TOE) FRACTURE (Left) TENDON REPAIR GREAT TOE (Left) as a surgical intervention .  The patient's history has been reviewed, patient examined, no change in status, stable for surgery.  I have reviewed the patient's chart and labs.  Questions were answered to the patient's satisfaction.     Marai Teehan C

## 2013-09-08 ENCOUNTER — Encounter (HOSPITAL_COMMUNITY): Payer: Self-pay | Admitting: Orthopaedic Surgery

## 2013-09-08 MED ORDER — POLYVINYL ALCOHOL 1.4 % OP SOLN
2.0000 [drp] | Freq: Three times a day (TID) | OPHTHALMIC | Status: DC
  Administered 2013-09-08 – 2013-09-10 (×7): 2 [drp] via OPHTHALMIC
  Filled 2013-09-08 (×2): qty 15

## 2013-09-08 MED ORDER — KETOROLAC TROMETHAMINE 30 MG/ML IJ SOLN
30.0000 mg | Freq: Four times a day (QID) | INTRAMUSCULAR | Status: DC | PRN
  Administered 2013-09-08 – 2013-09-09 (×3): 30 mg via INTRAVENOUS
  Filled 2013-09-08 (×4): qty 1

## 2013-09-08 NOTE — Op Note (Signed)
[name], [name] NO.:  [id number]  MEDICAL RECORD NO.:  [id number]  LOCATION:  [id number]                        FACILITY:  [facility]  PHYSICIAN:  Eason Housman C. Ophelia Charter, M.D.    DATE OF BIRTH:  [birth year]  DATE OF PROCEDURE:  09/07/2013 DATE OF DISCHARGE:                              OPERATIVE REPORT   PREOPERATIVE DIAGNOSIS:  Left great toe crush injury.  Multiple metatarsal fractures.  Displaced first metatarsal shaft fracture with comminution.  Great toe first MTP open injury with subluxation, great toe IP joint dislocation with volar plate anterior position.  POSTOPERATIVE DIAGNOSIS:  Left great toe crush injury.  Multiple metatarsal fractures.  Displaced first metatarsal shaft fracture with comminution.  Great toe first MTP open injury with subluxation, great toe IP joint dislocation with volar plate anterior position.  PROCEDURE:  Repeat left foot irrigation and debridement.  Open reduction and internal fixation and plating first metatarsal shaft fracture.  Open reduction and internal fixation, great toe MTP, dislocation and open reduction, internal fixation.  Great toe interphalangeal joint dislocation.  SURGEON:  Isabelly Kobler C. Ophelia Charter, MD  ASSISTANT:  Wende Neighbors, PA-C  ANESTHESIA:  General.  BRIEF HISTORY:  This patient had crush injury when the foot was caught between forklift and a large metal mold that was used in the plastic injury crushing of the foot.  The injury caused fractures of the third, fourth, and fifth metatarsals, open first metatarsal shaft fracture and lacerations and split between the great toe and second toe, third toe, fourth toe, and multiple lacerations between the fourth and fifth toe webspace.  The patient was previously on the operating room September 04, 2013, for washout and closure of all these separate injuries. Compartments did not need to be released.  He was stabilized with K-wire fixation, and is brought back for repeat  irrigation and debridement and then stabilization of his fractures.  CT scan obtained preoperatively showed stabilization of the first metatarsal joint and no evidence of Lisfranc injury.  PROCEDURE IN DETAIL:  After induction of general anesthesia, preoperative Ancef prophylaxis.  Dressing was removed.  Standard Betadine scrub, Betadine solution was applied for prep and a proximal thigh tourniquet had been applied.  Stockinette and extremity sterile drapes were applied, and time-out procedure was completed.  Inspection of the foot showed that there was some peeling of the epidermis.  The sutures were harvested from the open fracture injury that was along the medial aspect of the foot midway between the tarsometatarsal joint and MTP joint.  This open area involved about half the length of the metatarsal bone.  Skin marker was used and the leg was elevated, wrapped in Esmarch, tourniquet inflated to 350.  Incision was lengthened after removing the old sutures.  Compartments in the foot were soft, and although skin had some maceration, there was no evidence of skin necrosis and it appeared that the skin over the dorsum was not a concern, and the patient has kept his foot elevated for the last few days, and inspection of the skin showed that it was ready for stabilization with plating.  Worst are of the skin was primarily over the dorsum and over the second metatarsal shaft.  Incision along the medial aspect was extended all the way down to  the IP joint of the great toe.  Femoral branch of the saphenous supplies sensation to the medial side of the great toe was noted.  The patient had developed some return of toe sensation since his original washout injury when had decreased sensation to the toe.  The CT scan with reconstruction showed that the IP joint was still subluxed, and there was still a gap about twice normal space between the IP joint distance.  There is also subluxation of the  MTP joint.  Both these injuries have evidence of air in the joint on CT scan and both these injuries were opened from the lacerations from the significant crush.  Capsule of the IP joint was open.  The volar plate was flipped up into the joint.  The flexor tendon was intact and was able to be visualized from mid portion of the metatarsal out, across was sesamoid still in satisfactory position on both CT scan as well as fluoroscopic visualization.  It attached distally with FHL and the distal phalanx.  Volar plate was reduced in position, and the extensor tendon looked somewhat macerated, but there was no evidence of partial tearing and it extended all the way down to the distal phalanx and was intact.  It was followed up all the way to the tarsometatarsal joint.  Periosteum was peeled and the comminuted fracture had a small piece that had been not loose at the level of the fracture, which was mid first metatarsal, and this piece was displaced dorsally and proximally and was sitting directly over the tarsometatarsal joint in the subcutaneous tissue, making the skin slightly prominent.  This piece was mobilized, washed up, and self- retaining retractors were not used and Homans were used for protection of the soft tissue.  Lobster claw clamp was used as well as towel clip reduction clamp.  Using the Synthes foot mini frag set, a 6 hole plate was selected and a custom bend in it so that it would follow the normal path of the medial aspect of the first metatarsal.  It was checked under fluoroscopy and adjusted and reduction was performed.  The small butterfly piece was replaced and inset slightly dorsal, but mostly medial at the fracture site.  Once it was reduced, proximal 2 holes were screw bicortical and then distal screws.  A 062 K-wire was then drilled distally out of the distal phalanx, and this was a double-ended.  It was then passed proximally and back across the proximal phalanx and  had to be adjusted and repositioned of the metatarsal phalangeal joint to reduce the subluxation of that joint.  Once it was held in proper position since the entire capsule had ruptured from the crush injury, the joint was not pinned, and then AP, lateral, and oblique fluoroscopic pictures were taken to confirm that the joint had been properly reduced both IP joint and also metatarsal phalangeal joint.  Fracture showed good position.  Screws were at appropriate length.  Wound was copiously irrigated.  Tourniquet was deflated, and far-near-near-far sutures were used at the mid portion of the incision where the skin macerated was and is in good condition over the medial aspect of the great toe, simple 2-0 nylon sutures were used for skin reapproximation.  The skin was not under undue tension.  Did not require any remote releases.  Foot remained soft, although was puffy.  Compartments did not feel tight or tense.  Xeroform was  applied and 4 x 4s pin was bent at the great toe, cut pin protector applied, Xeroform applied, and the sutures between fourth and fifth toes, third and fourth toes, and also again second toe with multiple lacerations had been closed.  All skin edges looked good. The 4 x 4s, ABD, 6-inch Webril, and large fluffy giant cotton roll was applied for compressive wrap followed by 6-inch Coban.  The patient tolerated the procedure well transferred to the recovery room in stable condition.     Ailana Cuadrado C. Ophelia Charter, M.D.     MCY/MEDQ  D:  09/07/2013  T:  09/08/2013  Job:  [id number]

## 2013-09-08 NOTE — Progress Notes (Signed)
PT Cancellation Note  Patient Details Name: [name] MRN: [id number] DOB: November 11, 1986   Cancelled Treatment:    Reason Eval/Treat Not Completed: Patient declined, no reason specified (Pt just returned to bed and refusing PT at this time.  )  Will plan to see pt tomorrow.   [name] 09/08/2013, 3:20 PM  Jake Shark, PT DPT [phone]

## 2013-09-08 NOTE — Anesthesia Postprocedure Evaluation (Signed)
  Anesthesia Post-op Note  Patient: [name]  Procedure(s) Performed: Procedure(s): IRRIGATION AND DEBRIDEMENT EXTREMITY (Left) OPEN REDUCTION INTERNAL FIXATION (ORIF)  GREAT TOE IP JOINT DISLOCATION, ORIF 1ST MTP JOINT DISLOCATION AND ORIF 1ST METATARSAL SHAFT FRACTURE. (Left)  Patient Location: PACU  Anesthesia Type:General  Level of Consciousness: awake  Airway and Oxygen Therapy: Patient Spontanous Breathing  Post-op Pain: mild  Post-op Assessment: Post-op Vital signs reviewed, Patient's Cardiovascular Status Stable, Respiratory Function Stable, Patent Airway, No signs of Nausea or vomiting and Pain level controlled  Post-op Vital Signs: Reviewed and stable  Complications: No apparent anesthesia complications

## 2013-09-08 NOTE — Progress Notes (Signed)
Patient complaining of dry, red eyes and blurry vision.  Dr. Ophelia Charter notified; new order received for artificial tears.  Will continue to monitor.

## 2013-09-08 NOTE — Progress Notes (Signed)
Subjective: 1 Day Post-Op Procedure(s) (LRB): IRRIGATION AND DEBRIDEMENT EXTREMITY (Left) OPEN REDUCTION INTERNAL FIXATION (ORIF)  GREAT TOE IP JOINT DISLOCATION, ORIF 1ST MTP JOINT DISLOCATION AND ORIF 1ST METATARSAL SHAFT FRACTURE. (Left) Patient reports pain as moderate.    Objective: Vital signs in last 24 hours: Temp:  [97.6 F (36.4 C)-100.5 F (38.1 C)] 98.6 F (37 C) (09/30 0727) Pulse Rate:  [76-101] 95 (09/30 0727) Resp:  [12-18] 18 (09/30 0727) BP: (100-135)/(59-85) 112/65 mmHg (09/30 0727) SpO2:  [95 %-100 %] 99 % (09/30 0727)  Intake/Output from previous day: 09/29 0701 - 09/30 0700 In: 2550 [P.O.:240; I.V.:2310] Out: 900 [Urine:900] Intake/Output this shift:    No results found for this basename: HGB,  in the last 72 hours No results found for this basename: WBC, RBC, HCT, PLT,  in the last 72 hours No results found for this basename: NA, K, CL, CO2, BUN, CREATININE, GLUCOSE, CALCIUM,  in the last 72 hours No results found for this basename: LABPT, INR,  in the last 72 hours  dressing intact  . NEEDS FOOT ELEVATE  Assessment/Plan: 1 Day Post-Op Procedure(s) (LRB): IRRIGATION AND DEBRIDEMENT EXTREMITY (Left) OPEN REDUCTION INTERNAL FIXATION (ORIF)  GREAT TOE IP JOINT DISLOCATION, ORIF 1ST MTP JOINT DISLOCATION AND ORIF 1ST METATARSAL SHAFT FRACTURE. (Left) Up with therapy    Possible home Wednesday  Yovanni Frenette C 09/08/2013, 8:07 AM

## 2013-09-09 LAB — CBC
MCH: 27.7 pg (ref 26.0–34.0)
Platelets: 201 10*3/uL (ref 150–400)
RBC: 4.51 MIL/uL (ref 4.22–5.81)
RDW: 13.5 % (ref 11.5–15.5)
WBC: 8 10*3/uL (ref 4.0–10.5)

## 2013-09-09 MED ORDER — ASPIRIN EC 325 MG PO TBEC: 325.0000 mg | DELAYED_RELEASE_TABLET | Freq: Every day | ORAL | Status: DC

## 2013-09-09 MED ORDER — METHOCARBAMOL 500 MG PO TABS: 500.0000 mg | ORAL_TABLET | Freq: Four times a day (QID) | ORAL | Status: DC | PRN

## 2013-09-09 MED ORDER — OXYCODONE-ACETAMINOPHEN 5-325 MG PO TABS: 1.0000 | ORAL_TABLET | ORAL | Status: DC | PRN

## 2013-09-09 NOTE — Progress Notes (Signed)
Subjective: 2 Days Post-Op Procedure(s) (LRB): IRRIGATION AND DEBRIDEMENT EXTREMITY (Left) OPEN REDUCTION INTERNAL FIXATION (ORIF)  GREAT TOE IP JOINT DISLOCATION, ORIF 1ST MTP JOINT DISLOCATION AND ORIF 1ST METATARSAL SHAFT FRACTURE. (Left) Patient reports pain as moderate.   Progressing slowly.  Pain controlled. Will be going home with family. OT consult ordered  Objective: Vital signs in last 24 hours: Temp:  [98.3 F (36.8 C)-99.1 F (37.3 C)] 98.3 F (36.8 C) (10/01 1300) Pulse Rate:  [72-86] 86 (10/01 1300) Resp:  [16] 16 (10/01 1300) BP: (102-118)/(58-68) 118/68 mmHg (10/01 1300) SpO2:  [95 %-99 %] 95 % (10/01 1300)  Intake/Output from previous day: 09/30 0701 - 10/01 0700 In: 1320 [P.O.:960; I.V.:360] Out: 200 [Urine:200] Intake/Output this shift: Total I/O In: 450 [P.O.:450] Out: 350 [Urine:350]   Recent Labs  09/09/13 0555  HGB 12.5*    Recent Labs  09/09/13 0555  WBC 8.0  RBC 4.51  HCT 37.4*  PLT 201   No results found for this basename: NA, K, CL, CO2, BUN, CREATININE, GLUCOSE, CALCIUM,  in the last 72 hours No results found for this basename: LABPT, INR,  in the last 72 hours  Sensation intact distally Intact pulses distally Incision: dressing changed and wounds healing.  moderate edema and drainage.  no purulence or breakdown.  Assessment/Plan: 2 Days Post-Op Procedure(s) (LRB): IRRIGATION AND DEBRIDEMENT EXTREMITY (Left) OPEN REDUCTION INTERNAL FIXATION (ORIF)  GREAT TOE IP JOINT DISLOCATION, ORIF 1ST MTP JOINT DISLOCATION AND ORIF 1ST METATARSAL SHAFT FRACTURE. (Left) Plan for discharge tomorrow  [name] M 09/09/2013, 4:04 PM

## 2013-09-09 NOTE — Evaluation (Signed)
Physical Therapy Evaluation Patient Details Name: [name] MRN: [id number] DOB: 04-12-1986 Today's Date: 09/09/2013 Time: 4696-2952 PT Time Calculation (min): 25 min  PT Assessment / Plan / Recommendation History of Present Illness  crush injury L foot; s/p ORIF; NWB LLE  Clinical Impression  Patient is s/p above surgery resulting in functional limitations due to the deficits listed below (see PT Problem List).   Patient will benefit from skilled PT to increase their independence and safety with mobility to allow discharge to the venue listed below.        PT Assessment  Patient needs continued PT services    Follow Up Recommendations  Home health PT (versus The potential need for Outpatient PT can be addressed at Ortho follow-up appointments)    Does the patient have the potential to tolerate intense rehabilitation      Barriers to Discharge Inaccessible home environment Has an entire flight of steps to reach apt; Hopefully he will be able to manage with crutches and rail    Equipment Recommendations  Rolling walker with 5" wheels;3in1 (PT);Crutches    Recommendations for Other Services OT consult   Frequency Min 6X/week    Precautions / Restrictions Precautions Precautions: None Restrictions Weight Bearing Restrictions: Yes LLE Weight Bearing: Non weight bearing   Pertinent Vitals/Pain Minimal pain: was premedicated patient repositioned for comfort  elevated for edema and pain control       Mobility  Bed Mobility Bed Mobility: Sit to Supine Sit to Supine: 4: Min guard Details for Bed Mobility Assistance: Cues for technique Transfers Transfers: Sit to Stand;Stand to Sit Sit to Stand: 5: Supervision;From chair/3-in-1 Stand to Sit: 5: Supervision;To bed Details for Transfer Assistance: Cues for technique Ambulation/Gait Ambulation/Gait Assistance: 5: Supervision Ambulation Distance (Feet): 20 Feet Assistive device: Rolling walker Ambulation/Gait  Assistance Details: verbal and demo cues to bear entire body weight into RW for R foot advancement; Pt managing NWB well    Exercises     PT Diagnosis: Difficulty walking;Acute pain  PT Problem List: Decreased activity tolerance;Decreased balance;Decreased mobility;Decreased knowledge of use of DME;Pain;Decreased knowledge of precautions PT Treatment Interventions: DME instruction;Gait training;Stair training;Functional mobility training;Therapeutic activities;Therapeutic exercise;Balance training;Patient/family education     PT Goals(Current goals can be found in the care plan section) Acute Rehab PT Goals Patient Stated Goal: be able to get up steps PT Goal Formulation: With patient Time For Goal Achievement: 09/16/13 Potential to Achieve Goals: Good  Visit Information  Last PT Received On: 09/09/13 Assistance Needed: +1 (consider +2 for flight of steps) History of Present Illness: crush injury L foot; s/p ORIF; NWB LLE       Prior Functioning  Home Living Family/patient expects to be discharged to:: Private residence Living Arrangements: Other (Comment) (Grandparents) Available Help at Discharge: Family;Available 24 hours/day Type of Home: Apartment Home Access: Stairs to enter Entrance Stairs-Number of Steps: 10 Entrance Stairs-Rails: Right;Left;Can reach both Home Layout: One level Home Equipment: None Prior Function Level of Independence: Independent Communication Communication: No difficulties Dominant Hand: Right    Cognition  Cognition Arousal/Alertness: Awake/alert Behavior During Therapy: WFL for tasks assessed/performed Overall Cognitive Status: Within Functional Limits for tasks assessed    Extremity/Trunk Assessment Upper Extremity Assessment Upper Extremity Assessment: Overall WFL for tasks assessed Lower Extremity Assessment Lower Extremity Assessment: LLE deficits/detail LLE Deficits / Details: Able to perform SLR; Ankle immobilized in bulky dressing    Balance    End of Session PT - End of Session Activity Tolerance: Patient tolerated treatment well Patient  left: in bed;with call bell/phone within reach;with family/visitor present Nurse Communication: Mobility status  GP     Van Clines [facility] [place], [place] [phone]  09/09/2013, 2:37 PM

## 2013-09-10 MED ORDER — OXYCODONE HCL 5 MG PO TABS
5.0000 mg | ORAL_TABLET | Freq: Once | ORAL | Status: AC
  Administered 2013-09-10: 5 mg via ORAL
  Filled 2013-09-10: qty 1

## 2013-09-10 NOTE — Progress Notes (Signed)
Patient d/c to home this pm with family.  Prescriptions given and instructions reviewed with patient and family.

## 2013-09-10 NOTE — Progress Notes (Signed)
09/10/13 Received call from [facility], [phone],spoke with Clarissa and then [place]. [employer] had contacted them to set up the HHPT and DME. They set up equipment with [employer] and HHPT with [facility]. Care Manager is Luetta Nutting [phone]. Gave patient's mother name and # of case manager and [employer] agency. [employer] delivered rolling walker to the room and will deliver 3N1 and tub bench to home this pm. Jacquelynn Cree RN, BSN, CCM    09/09/13 Spoke with Kendal Hymen in [employer], worker comp [employer] ID # [id number], Aldean Baker [phone] ext [phone].Spoke with Ms Terrilee Croak, faxed orders for HHPT, 3N1, rolling walker and tub bench, H and P and op note to [phone]. Received confirmation. Jacquelynn Cree RN, [facility]    09/08/13 Left 2 messages for human resources at patient's employer.Jacquelynn Cree RN, BSN, CCM     09/07/13 Spoke with patient then contacted his employer to get workers comp #. Called Human Resources [phone] ext [phone] and left messages, awaiting call back. Will contact workers comp once I have their #. Will continue to follow. Jacquelynn Cree RN, BSN, CCM

## 2013-09-10 NOTE — Progress Notes (Signed)
Physical Therapy Treatment Patient Details Name: [name] MRN: [id number] DOB: 1985-12-25 Today's Date: 09/10/2013 Time: 1308-6578 PT Time Calculation (min): 33 min  PT Assessment / Plan / Recommendation  History of Present Illness crush injury L foot; s/p ORIF; NWB LLE   PT Comments   Pt deferred PT in a.m. Due to pain and wanting muscle relaxer and pain meds prior to PT (he had pain meds one hour prior, but also wanted muscle relaxer). Agreed to work with PT after 12:30-12:50 doses of meds.   In pm, required incr time and instruction on stairs due to his initial refusal to attempt with crutches. After performing 5 steps with rail and open RW beside him (going forwards) with assist to brace RW, he agreed he wanted to try crutches for stairs and still wants RW for ambulating in apartment. Girlfriend and mother present throughout session and girlfriend assisted pt on last 5 steps. Pt OK for d/c from a mobility standpoint.   Follow Up Recommendations  Home health PT;Supervision for mobility/OOB     Does the patient have the potential to tolerate intense rehabilitation     Barriers to Discharge        Equipment Recommendations  Rolling walker with 5" wheels;Crutches    Recommendations for Other Services    Frequency Min 6X/week   Progress towards PT Goals Progress towards PT goals: Progressing toward goals  Plan Current plan remains appropriate    Precautions / Restrictions Precautions Precautions: None Restrictions LLE Weight Bearing: Non weight bearing   Pertinent Vitals/Pain 8/10 Lt foot pain    Mobility  Bed Mobility Bed Mobility: Supine to Sit;Sitting - Scoot to Edge of Bed Supine to Sit: 6: Modified independent (Device/Increase time) Sitting - Scoot to Edge of Bed: 7: Independent Sit to Supine: 6: Modified independent (Device/Increase time) Transfers Transfers: Sit to Stand;Stand to Sit Sit to Stand: 5: Supervision;With upper extremity assist Stand to Sit:  5: Supervision;With upper extremity assist Details for Transfer Assistance: vc for safe use of RW; x4 reps Ambulation/Gait Ambulation/Gait Assistance: 5: Supervision Ambulation Distance (Feet): 40 Feet Assistive device: Rolling walker Ambulation/Gait Assistance Details: vc for placement of LLE either in front of him or with knee bent, behind him Gait Pattern: Step-to pattern Stairs: Yes Stairs Assistance: 4: Min assist Stairs Assistance Details (indicate cue type and reason): steadying assist at waist; vc and demo for technique (shown with RW and rail forwards--RW folded and open; RW backwards; sitting/bumping up steps; sideways with one rail; and ultimately used rail and one crutch) Stair Management Technique: One rail Left;Step to pattern;Forwards;With crutches Number of Stairs: 12    Exercises     PT Diagnosis:    PT Problem List:   PT Treatment Interventions:     PT Goals (current goals can now be found in the care plan section)    Visit Information  Last PT Received On: 09/10/13 Assistance Needed: +1 History of Present Illness: crush injury L foot; s/p ORIF; NWB LLE    Subjective Data  Subjective: During a.m. attempt to see pt he stated he did not want crutches. with stair training, he realized crutches would be easiest for stairs, still wants RW for walking around apartment   Cognition  Cognition Arousal/Alertness: Awake/alert Behavior During Therapy: [facility] for tasks assessed/performed Overall Cognitive Status: Within Functional Limits for tasks assessed    Balance     End of Session PT - End of Session Equipment Utilized During Treatment: Gait belt Activity Tolerance: Patient tolerated treatment  well Patient left: in bed;with call bell/phone within reach;with family/visitor present Nurse Communication: Mobility status;Other (comment) (needs crutches and RW; OK to d/c from PT standpoint)    GP     Holley Wirt 09/10/2013, 2:49 PM Pager [phone]

## 2013-09-10 NOTE — Evaluation (Signed)
Occupational Therapy Evaluation and Discharge Patient Details Name: EVERT WENRICH MRN: [id number] DOB: 1986/03/25 Today's Date: 09/10/2013 Time: 5784-6962 OT Time Calculation (min): 19 min  OT Assessment / Plan / Recommendation History of present illness crush injury L foot; s/p ORIF; NWB LLE   Clinical Impression   This 27 yo male s/p above presents to acute OT with problems below. All education completed, will D/C from acute OT.    OT Assessment  Patient does not need any further OT services    Follow Up Recommendations  No OT follow up       Equipment Recommendations  3 in 1 bedside comode;Tub/shower bench          Precautions / Restrictions Precautions Precautions: None Restrictions LLE Weight Bearing: Non weight bearing       ADL  Transfers/Ambulation Related to ADLs: S for all with RW ADL Comments: Set up for UB/LB ADLs. Talked with pt and family about triple bagging his LLE to shower while seated on tub transfer bench and even demonstrated with him how to bag LLE. Educated pt on wearing athletic shorts that are baggy and stretchy to increase ease of dressing LB        Visit Information  Last OT Received On: 09/10/13 Assistance Needed: +1 History of Present Illness: crush injury L foot; s/p ORIF; NWB LLE       Prior Functioning     Home Living Family/patient expects to be discharged to:: Private residence Living Arrangements:  (grandparents) Available Help at Discharge: Family;Available 24 hours/day Type of Home: Apartment Home Access: Stairs to enter Entrance Stairs-Number of Steps: 10 Entrance Stairs-Rails: Right;Left;Can reach both Home Layout: One level Home Equipment: None Prior Function Level of Independence: Independent Communication Communication: No difficulties Dominant Hand: Right         Vision/Perception Vision - History Patient Visual Report: No change from baseline   Cognition  Cognition Arousal/Alertness:  Awake/alert Behavior During Therapy: WFL for tasks assessed/performed Overall Cognitive Status: Within Functional Limits for tasks assessed    Extremity/Trunk Assessment Upper Extremity Assessment Upper Extremity Assessment: Overall WFL for tasks assessed     Mobility Bed Mobility Bed Mobility: Supine to Sit;Sitting - Scoot to Edge of Bed Supine to Sit: 6: Modified independent (Device/Increase time);HOB elevated Sitting - Scoot to Edge of Bed: 7: Independent Transfers Transfers: Sit to Stand;Stand to Sit Sit to Stand: 5: Supervision;With upper extremity assist;From bed Stand to Sit: 5: Supervision;With upper extremity assist;With armrests;To chair/3-in-1 Details for Transfer Assistance: Pt safe with technique           End of Session OT - End of Session Equipment Utilized During Treatment: Gait belt;Rolling walker Activity Tolerance: Patient tolerated treatment well Patient left:  (with PT getting ready to go do the steps)       Evette Georges [phone] 09/10/2013, 2:35 PM

## 2013-09-11 ENCOUNTER — Encounter (HOSPITAL_COMMUNITY): Payer: Self-pay | Admitting: Orthopaedic Surgery

## 2013-09-11 NOTE — Discharge Summary (Signed)
Physician Discharge Summary  Patient ID: [name] MRN: [id number] DOB/AGE: 04/20/1986 26 y.o.  Admit date: 09/04/2013 Discharge date: 09/11/2013  Admission Diagnoses:  Crushing injury of toe with foot, left  Discharge Diagnoses:  Left foot crush injury with open 1st MT shaft Fx, multiple foot ,toe lacerations. 1st MTP dislocation, great toe IP dislocation.  1st, 3rd 4th and 5th MT fx's  Past Medical History  Diagnosis Date  . STD (male)     Surgeries: Procedure(s): ON 09/04/2013: 1.IRRIGATION AND DEBRIDEMENT LEFT FIRST METATARSAL SHAFT OPEN      FRACTURE AND REDUCTION AND PINNING 2.EXCISIONAL DEBRIDEMENT , REDUCTION AND PINNING OF THE LEFT       GREAT TOE JOINT DISLOCATION 3.CLOSURE OF LACERATION OF THIRD AND FORTH TOE WEB SPACE AND     FORTH AND FIFTH TOE WEB SPACE.  ON 09/07/2013 1.OPEN REDUCTION INTERNAL FIXATION (ORIF) 1st MT shaft fracture,  GREAT TOE MTP AND  IP        JOINT DISLOCATION reduction and fixation with pinning 2. ORIF 1ST METATARSAL SHAFT FRACTURE. on 09/04/2013 - 09/07/2013   Consultants (if any):  NONE  Discharged Condition: Improved  Hospital Course: [name] is an 27 y.o. male who was admitted 09/04/2013 with a diagnosis of Crushing injury of toe with foot, left and went to the operating room on 09/04/2013 - 09/07/2013 and underwent the above named procedures.    He was given perioperative antibiotics:  Anti-infectives   Start     Dose/Rate Route Frequency Ordered Stop   09/07/13 2200  ceFAZolin (ANCEF) IVPB 1 g/50 mL premix  Status:  Discontinued     1 g 100 mL/hr over 30 Minutes Intravenous 3 times per day 09/07/13 1808 09/10/13 1137   09/07/13 1600  ceFAZolin (ANCEF) IVPB 2 g/50 mL premix  Status:  Discontinued     2 g 100 mL/hr over 30 Minutes Intravenous  Once 09/07/13 1553 09/07/13 1808   09/04/13 1800  ceFAZolin (ANCEF) IVPB 1 g/50 mL premix     1 g 100 mL/hr over 30 Minutes Intravenous Every 6 hours 09/04/13 1351 09/05/13 0611    09/04/13 1145  ceFAZolin (ANCEF) IVPB 2 g/50 mL premix     2 g 100 mL/hr over 30 Minutes Intravenous  Once 09/04/13 1141 09/04/13 1140   09/04/13 0545  ceFAZolin (ANCEF) IVPB 1 g/50 mL premix     1 g 100 mL/hr over 30 Minutes Intravenous  Once 09/04/13 0541 09/04/13 0646    .  He was given sequential compression devices, early ambulation, and ASPIRIN  for DVT prophylaxis.  He benefited maximally from the hospital stay and there were no complications.    Recent vital signs:  Filed Vitals:   09/10/13 1400  BP: 118/64  Pulse: 97  Temp: 98.7 F (37.1 C)  Resp: 17    Recent laboratory studies:  Lab Results  Component Value Date   HGB 12.5* 09/09/2013   HGB 15.1 09/04/2013   Lab Results  Component Value Date   WBC 8.0 09/09/2013   PLT 201 09/09/2013   No results found for this basename: INR   Lab Results  Component Value Date   NA 141 09/04/2013   K 3.4* 09/04/2013   CL 104 09/04/2013   CO2 26 09/04/2013   BUN 16 09/04/2013   CREATININE 1.10 09/04/2013   GLUCOSE 155* 09/04/2013    Discharge Medications:     Medication List         aspirin EC 325 MG tablet  Take 1 tablet (325 mg total) by mouth daily.     methocarbamol 500 MG tablet  Commonly known as:  ROBAXIN  Take 1 tablet (500 mg total) by mouth every 6 (six) hours as needed (spasm).     oxyCODONE-acetaminophen 5-325 MG per tablet  Commonly known as:  PERCOCET/ROXICET  Take 1-2 tablets by mouth every 4 (four) hours as needed.        Diagnostic Studies: Ct Foot Left Wo Contrast  09/06/2013   CLINICAL DATA:  Lisfranc fracture dislocation.  EXAM: CT OF THE LEFT FOOT WITHOUT CONTRAST  TECHNIQUE: Multidetector CT imaging was performed according to the standard protocol. Multiplanar CT image reconstructions were also generated.  COMPARISON:  09/04/2013 fluoroscopy and foot radiographs.  FINDINGS: K-wire fixation is present through the forefoot. There is a transversely oriented K-wire from a medial approach that extends  through the distal 1st metatarsal shaft and terminates in the soft tissues dorsal to the 2nd metatarsal. There is a 1st metatarsal shaft fracture involving the proximal diaphysis with persistent apex dorsal angulation. One cortex width lateral displacement of the distal shaft. There is a cortical shard dorsal to the proximal metaphysis.  The 2nd metatarsal is intact. There is an oblique fracture of the distal 3rd metatarsal shaft which is minimally displaced. Third metatarsal base fracture extends into the tarsometatarsal joint and intermetatarsal joint but is nondisplaced. Fourth metatarsal intact. Transverse fracture of the 5th metatarsal base with comminuted fracture of the 5th metatarsal shaft. Fifth metatarsal base fracture extends intra-articular at the tarsometatarsal junction. Nondisplaced fracture of middle phalanx of the small toe.  K-wire fixation of the phalanges of the great toe present, with K-wire terminating in the 1st metatarsal head. Gas is present in the soft tissues of the foot which may be associated with the soft tissue injury or instrumentation. The alignment of the 1st and 2nd tarsometatarsal junction has been restored and there is no Lisfranc injury displacement. Talus, calcaneus and cuboid appear within normal limits.  Posterior medial and posterior lateral tendons appear intact. Diffuse soft tissue swelling is present on the foot.  IMPRESSION: Complex fractures of the left foot detailed above. K-wire fixation through the distal 1st metatarsal shaft has suspect placement with the K-wire terminating in the dorsal soft tissues rather than in the 2nd metatarsal. No displacement of the Lisfranc joint.   Electronically Signed   By: Andreas Newport M.D.   On: 09/06/2013 12:20   Ct 3d Recon At Scanner  09/06/2013   CLINICAL DATA:  Abnormal findings on recent x-ray imaging which demonstrated a fracture of metatarsals of the left foot.  EXAM: 3-DIMENSIONAL CT IMAGE RENDERING ON INDEPENDENT  WORKSTATION  TECHNIQUE: 3-dimensional CT images were rendered by post-processing of the original CT data at independent workstation. The 3-dimensional CT images were interpreted, and findings were reported in the accompanying complete CT report for this study.  FINDINGS: Three dimensional reconstructions were performed by the radiologist at an independent workstation. Complex left foot fracture.  IMPRESSION: As above.   Electronically Signed   By: Andreas Newport M.D.   On: 09/06/2013 12:25   Dg Foot Complete Left  09/04/2013   CLINICAL DATA:  Intraoperative pinning of comminuted fracture of the 1st metatarsal and the dislocated IP joint of the great toe.  EXAM: LEFT FOOT - COMPLETE 3+ VIEW AND C-ARM 1-60 MIN  COMPARISON:  Right foot x-rays earlier same date 6:17 a.m.  FINDINGS: Three spot images from the C-arm fluoroscopic device, AP, lateral, and oblique views of  the distal foot, submitted for interpretation post-operatively demonstrate a K-wire traversing the distal 1st metatarsal, with marked improvement in alignment of the comminuted midshaft fracture. A K-wire traverses the phalanges of the great toe it with its tip in the head of the 1st metatarsal, with reduction of the dislocated IP joint.  IMPRESSION: Improved alignment post intraoperative pinning with K-wires.   Electronically Signed   By: Hulan Saas   On: 09/04/2013 15:29   Dg Foot Complete Left  09/04/2013   CLINICAL DATA:  Crush injury.  Left foot pain.  EXAM: LEFT FOOT - COMPLETE 3+ VIEW  COMPARISON:  None.  FINDINGS: The patient has a transverse fracture through the proximal diaphysis of the 1st metatarsal with 1 shaft with lateral displacement and approximately 1.8 cm of foreshortening. There is also fracture through the distal diaphysis of the 3rd metatarsal with approximately 15 degrees of medial angulation. Nondisplaced comminuted fracture through the diaphysis of the 5th metatarsal is also identified.  Also seen is a fracture through  the head of the middle phalanx of the little toe on the medial side. The DIP joint of the great toe is markedly widened at 0.7 cm with gas present between the phalanges. There is a subtle linear density between the bases of the 2nd and 3rd metatarsals which could be due to a fracture although the donor site is not visualized.  IMPRESSION: Fractures of the 1st, 3rd and 5th metatarsals as described.  Separation of the IP joint of the great toe with distraction of 0.7 cm.  Fracture of the head of the middle phalanx of the little toe.  Small linear density between the bases of the 2nd and 3rd metatarsals may be due to fracture although the donor site is not visible on this study.   Electronically Signed   By: Drusilla Kanner M.D.   On: 09/04/2013 06:32   Dg Toe Great Left  09/07/2013   *RADIOLOGY REPORT*  Clinical Data: Open reduction and internal fixation left great toe  LEFT GREAT TOE  Comparison: 09/06/2013  Findings: There is a pin through the distal and proximal phalanx, extending into the first metatarsal.  Bony defect involving proximal shaft of the first metatarsal is addressed by medial compression plate.  There is a moderately displaced fracture of the third metatarsal distally.  IMPRESSION: ORIF great toe   Original Report Authenticated By: Esperanza Heir, M.D.   Dg C-arm 1-60 Min  09/04/2013   CLINICAL DATA:  Intraoperative pinning of comminuted fracture of the 1st metatarsal and the dislocated IP joint of the great toe.  EXAM: LEFT FOOT - COMPLETE 3+ VIEW AND C-ARM 1-60 MIN  COMPARISON:  Right foot x-rays earlier same date 6:17 a.m.  FINDINGS: Three spot images from the C-arm fluoroscopic device, AP, lateral, and oblique views of the distal foot, submitted for interpretation post-operatively demonstrate a K-wire traversing the distal 1st metatarsal, with marked improvement in alignment of the comminuted midshaft fracture. A K-wire traverses the phalanges of the great toe it with its tip in the head  of the 1st metatarsal, with reduction of the dislocated IP joint.  IMPRESSION: Improved alignment post intraoperative pinning with K-wires.   Electronically Signed   By: Hulan Saas   On: 09/04/2013 15:29    Disposition: 01-Home or Self Care      Discharge Orders   Future Orders Complete By Expires   Call MD / Call 911  As directed    Comments:     If you experience  chest pain or shortness of breath, CALL 911 and be transported to the hospital emergency room.  If you develope a fever above 101 F, pus (white drainage) or increased drainage or redness at the wound, or calf pain, call your surgeon's office.   Constipation Prevention  As directed    Comments:     Drink plenty of fluids.  Prune juice may be helpful.  You may use a stool softener, such as Colace (over the counter) 100 mg twice a day.  Use MiraLax (over the counter) for constipation as needed.   Diet - low sodium heart healthy  As directed    Discharge instructions  As directed    Comments:     Keep foot elevated above heart and keep dressing dry and clean at all times.  No weight on foot.  Use walker for ambulation   Increase activity slowly as tolerated  As directed       Follow-up Information   Follow up with Tosh Glaze C, MD. Schedule an appointment as soon as possible for a visit in 1 week.   Specialty:  Orthopedic Surgery   Contact information:   [address] [place] [address] [phone]        Signed: Wende Neighbors 09/11/2013, 12:14 PM

## 2014-01-05 ENCOUNTER — Encounter (HOSPITAL_COMMUNITY): Payer: Self-pay | Admitting: *Deleted

## 2014-01-06 ENCOUNTER — Ambulatory Visit (HOSPITAL_COMMUNITY): Payer: Worker's Compensation | Admitting: Certified Registered"

## 2014-01-06 ENCOUNTER — Ambulatory Visit (HOSPITAL_COMMUNITY): Payer: Worker's Compensation

## 2014-01-06 ENCOUNTER — Encounter (HOSPITAL_COMMUNITY)
Admission: RE | Disposition: A | Discharge status: Home Health | Payer: Self-pay | Source: Ambulatory Visit | Attending: Orthopaedic Surgery

## 2014-01-06 ENCOUNTER — Encounter (HOSPITAL_COMMUNITY): Payer: Self-pay | Admitting: Family

## 2014-01-06 ENCOUNTER — Encounter (HOSPITAL_COMMUNITY): Payer: Worker's Compensation | Admitting: Certified Registered"

## 2014-01-06 ENCOUNTER — Other Ambulatory Visit (HOSPITAL_COMMUNITY): Payer: Self-pay | Admitting: Orthopaedic Surgery

## 2014-01-06 ENCOUNTER — Inpatient Hospital Stay (HOSPITAL_COMMUNITY)
Admission: RE | Admit: 2014-01-06 | Discharge: 2014-01-11 | DRG: 464 | Disposition: A | Discharge status: Home Health | Payer: Worker's Compensation | Source: Ambulatory Visit | Attending: Orthopaedic Surgery | Admitting: Orthopaedic Surgery

## 2014-01-06 DIAGNOSIS — F17200 Nicotine dependence, unspecified, uncomplicated: Secondary | ICD-10-CM | POA: Diagnosis present

## 2014-01-06 DIAGNOSIS — B965 Pseudomonas (aeruginosa) (mallei) (pseudomallei) as the cause of diseases classified elsewhere: Secondary | ICD-10-CM | POA: Diagnosis present

## 2014-01-06 DIAGNOSIS — IMO0002 Reserved for concepts with insufficient information to code with codable children: Secondary | ICD-10-CM | POA: Diagnosis present

## 2014-01-06 DIAGNOSIS — S8290XS Unspecified fracture of unspecified lower leg, sequela: Secondary | ICD-10-CM

## 2014-01-06 DIAGNOSIS — D573 Sickle-cell trait: Secondary | ICD-10-CM | POA: Diagnosis present

## 2014-01-06 DIAGNOSIS — M86172 Other acute osteomyelitis, left ankle and foot: Secondary | ICD-10-CM | POA: Diagnosis present

## 2014-01-06 DIAGNOSIS — M86179 Other acute osteomyelitis, unspecified ankle and foot: Principal | ICD-10-CM | POA: Diagnosis present

## 2014-01-06 HISTORY — DX: Sickle-cell trait: D57.3

## 2014-01-06 HISTORY — PX: HAMMER TOE SURGERY: SHX385

## 2014-01-06 LAB — CBC
HCT: 46.3 % (ref 39.0–52.0)
HEMOGLOBIN: 16 g/dL (ref 13.0–17.0)
MCH: 28.3 pg (ref 26.0–34.0)
MCHC: 34.6 g/dL (ref 30.0–36.0)
MCV: 81.8 fL (ref 78.0–100.0)
Platelets: 193 10*3/uL (ref 150–400)
RBC: 5.66 MIL/uL (ref 4.22–5.81)
RDW: 14.9 % (ref 11.5–15.5)
WBC: 4.8 10*3/uL (ref 4.0–10.5)

## 2014-01-06 LAB — COMPREHENSIVE METABOLIC PANEL
ALBUMIN: 3.8 g/dL (ref 3.5–5.2)
ALT: 15 U/L (ref 0–53)
AST: 15 U/L (ref 0–37)
Alkaline Phosphatase: 68 U/L (ref 39–117)
BILIRUBIN TOTAL: 0.8 mg/dL (ref 0.3–1.2)
BUN: 12 mg/dL (ref 6–23)
CHLORIDE: 102 meq/L (ref 96–112)
CO2: 26 mEq/L (ref 19–32)
CREATININE: 1.04 mg/dL (ref 0.50–1.35)
Calcium: 9.5 mg/dL (ref 8.4–10.5)
GFR calc non Af Amer: >90 mL/min (ref >90)
GLUCOSE: 88 mg/dL (ref 70–99)
Potassium: 3.9 mEq/L (ref 3.7–5.3)
Sodium: 140 mEq/L (ref 137–147)
Total Protein: 7.4 g/dL (ref 6.0–8.3)

## 2014-01-06 LAB — URINALYSIS, ROUTINE W REFLEX MICROSCOPIC
BILIRUBIN URINE: NEGATIVE
Glucose, UA: NEGATIVE mg/dL
HGB URINE DIPSTICK: NEGATIVE
KETONES UR: NEGATIVE mg/dL
Leukocytes, UA: NEGATIVE
NITRITE: NEGATIVE
PROTEIN: NEGATIVE mg/dL
SPECIFIC GRAVITY, URINE: 1.018 (ref 1.005–1.030)
UROBILINOGEN UA: 0.2 mg/dL (ref 0.0–1.0)
pH: 6 (ref 5.0–8.0)

## 2014-01-06 LAB — PROTIME-INR
INR: 1 (ref 0.00–1.49)
PROTHROMBIN TIME: 13 s (ref 11.6–15.2)

## 2014-01-06 LAB — C-REACTIVE PROTEIN: CRP: <0.5 mg/dL — ABNORMAL LOW (ref <0.60)

## 2014-01-06 LAB — SEDIMENTATION RATE: Sed Rate: 1 mm/hr (ref 0–16)

## 2014-01-06 SURGERY — CORRECTION, HAMMER TOE
Anesthesia: General | Site: Foot | Laterality: Left

## 2014-01-06 MED ORDER — DIPHENHYDRAMINE HCL 50 MG/ML IJ SOLN: 12.5000 mg | Freq: Four times a day (QID) | INTRAMUSCULAR | Status: DC | PRN

## 2014-01-06 MED ORDER — ONDANSETRON HCL 4 MG/2ML IJ SOLN: 4.0000 mg | Freq: Four times a day (QID) | INTRAMUSCULAR | Status: DC | PRN

## 2014-01-06 MED ORDER — HYDROMORPHONE HCL PF 1 MG/ML IJ SOLN
INTRAMUSCULAR | Status: AC
  Filled 2014-01-06: qty 1

## 2014-01-06 MED ORDER — MIDAZOLAM HCL 2 MG/2ML IJ SOLN
INTRAMUSCULAR | Status: AC
  Filled 2014-01-06: qty 2

## 2014-01-06 MED ORDER — HYDROMORPHONE 0.3 MG/ML IV SOLN
INTRAVENOUS | Status: DC
  Administered 2014-01-07: 23:00:00 via INTRAVENOUS
  Administered 2014-01-07: 2.4 mg via INTRAVENOUS
  Administered 2014-01-07: 7.5 mg via INTRAVENOUS
  Administered 2014-01-07: 07:00:00 via INTRAVENOUS
  Administered 2014-01-07: 1.44 mg via INTRAVENOUS
  Administered 2014-01-07: 1.8 mg via INTRAVENOUS
  Administered 2014-01-07: 1.5 mg via INTRAVENOUS
  Administered 2014-01-07: 5.7 mg via INTRAVENOUS
  Administered 2014-01-07: 1.5 mg via INTRAVENOUS
  Administered 2014-01-08: 3.9 mg via INTRAVENOUS
  Administered 2014-01-08: 05:00:00 via INTRAVENOUS
  Administered 2014-01-08: 3.6 mg via INTRAVENOUS
  Filled 2014-01-06 (×4): qty 25

## 2014-01-06 MED ORDER — ASPIRIN EC 325 MG PO TBEC
325.0000 mg | DELAYED_RELEASE_TABLET | Freq: Every day | ORAL | Status: DC
  Administered 2014-01-07 – 2014-01-11 (×5): 325 mg via ORAL
  Filled 2014-01-06 (×5): qty 1

## 2014-01-06 MED ORDER — KETOROLAC TROMETHAMINE 30 MG/ML IJ SOLN
INTRAMUSCULAR | Status: AC
  Filled 2014-01-06: qty 1

## 2014-01-06 MED ORDER — KETOROLAC TROMETHAMINE 30 MG/ML IJ SOLN
30.0000 mg | Freq: Once | INTRAMUSCULAR | Status: AC
  Administered 2014-01-06: 30 mg via INTRAVENOUS

## 2014-01-06 MED ORDER — PROPOFOL 10 MG/ML IV BOLUS
INTRAVENOUS | Status: DC | PRN
  Administered 2014-01-06: 30 mg via INTRAVENOUS
  Administered 2014-01-06: 200 mg via INTRAVENOUS

## 2014-01-06 MED ORDER — OXYCODONE-ACETAMINOPHEN 5-325 MG PO TABS
1.0000 | ORAL_TABLET | ORAL | Status: DC | PRN
  Administered 2014-01-08 (×2): 2 via ORAL
  Filled 2014-01-06 (×2): qty 2

## 2014-01-06 MED ORDER — METHOCARBAMOL 500 MG PO TABS
ORAL_TABLET | ORAL | Status: AC
  Administered 2014-01-06: 500 mg
  Filled 2014-01-06: qty 1

## 2014-01-06 MED ORDER — MORPHINE SULFATE 2 MG/ML IJ SOLN: 1.0000 mg | INTRAMUSCULAR | Status: DC | PRN

## 2014-01-06 MED ORDER — OXYCODONE HCL 5 MG/5ML PO SOLN: 5.0000 mg | Freq: Once | ORAL | Status: AC | PRN

## 2014-01-06 MED ORDER — METOCLOPRAMIDE HCL 5 MG/ML IJ SOLN: 5.0000 mg | Freq: Three times a day (TID) | INTRAMUSCULAR | Status: DC | PRN

## 2014-01-06 MED ORDER — FENTANYL CITRATE 0.05 MG/ML IJ SOLN
INTRAMUSCULAR | Status: AC
  Filled 2014-01-06: qty 5

## 2014-01-06 MED ORDER — 0.9 % SODIUM CHLORIDE (POUR BTL) OPTIME
TOPICAL | Status: DC | PRN
  Administered 2014-01-06: 1000 mL

## 2014-01-06 MED ORDER — ONDANSETRON HCL 4 MG/2ML IJ SOLN
INTRAMUSCULAR | Status: DC | PRN
  Administered 2014-01-06: 4 mg via INTRAVENOUS

## 2014-01-06 MED ORDER — PROPOFOL 10 MG/ML IV BOLUS
INTRAVENOUS | Status: AC
Start: 2014-01-06 — End: 2014-01-06
  Filled 2014-01-06: qty 20

## 2014-01-06 MED ORDER — ONDANSETRON HCL 4 MG PO TABS: 4.0000 mg | ORAL_TABLET | Freq: Four times a day (QID) | ORAL | Status: DC | PRN

## 2014-01-06 MED ORDER — SENNOSIDES-DOCUSATE SODIUM 8.6-50 MG PO TABS: 1.0000 | ORAL_TABLET | Freq: Every evening | ORAL | Status: DC | PRN

## 2014-01-06 MED ORDER — HYDROMORPHONE HCL PF 1 MG/ML IJ SOLN
0.5000 mg | INTRAMUSCULAR | Status: DC | PRN
  Administered 2014-01-06 (×3): 0.5 mg via INTRAVENOUS

## 2014-01-06 MED ORDER — HYDROMORPHONE 0.3 MG/ML IV SOLN
INTRAVENOUS | Status: AC
  Administered 2014-01-06: 22:00:00
  Filled 2014-01-06: qty 25

## 2014-01-06 MED ORDER — MIDAZOLAM HCL 5 MG/5ML IJ SOLN
INTRAMUSCULAR | Status: DC | PRN
  Administered 2014-01-06: 2 mg via INTRAVENOUS

## 2014-01-06 MED ORDER — OXYCODONE HCL 5 MG PO TABS
ORAL_TABLET | ORAL | Status: AC
  Filled 2014-01-06: qty 1

## 2014-01-06 MED ORDER — LACTATED RINGERS IV SOLN
INTRAVENOUS | Status: DC | PRN
  Administered 2014-01-06 (×2): via INTRAVENOUS

## 2014-01-06 MED ORDER — VANCOMYCIN HCL 1000 MG IV SOLR
INTRAVENOUS | Status: DC | PRN
  Administered 2014-01-06: 1000 mg

## 2014-01-06 MED ORDER — VANCOMYCIN HCL IN DEXTROSE 750-5 MG/150ML-% IV SOLN
750.0000 mg | Freq: Three times a day (TID) | INTRAVENOUS | Status: DC
  Administered 2014-01-06 – 2014-01-07 (×4): 750 mg via INTRAVENOUS
  Filled 2014-01-06 (×7): qty 150

## 2014-01-06 MED ORDER — DOCUSATE SODIUM 100 MG PO CAPS
100.0000 mg | ORAL_CAPSULE | Freq: Two times a day (BID) | ORAL | Status: DC
  Administered 2014-01-07 – 2014-01-11 (×9): 100 mg via ORAL
  Filled 2014-01-06 (×11): qty 1

## 2014-01-06 MED ORDER — HYDROMORPHONE HCL PF 1 MG/ML IJ SOLN
0.2500 mg | INTRAMUSCULAR | Status: DC | PRN
  Administered 2014-01-06 (×5): 0.5 mg via INTRAVENOUS

## 2014-01-06 MED ORDER — DIPHENHYDRAMINE HCL 12.5 MG/5ML PO ELIX: 12.5000 mg | ORAL_SOLUTION | ORAL | Status: DC | PRN

## 2014-01-06 MED ORDER — FLEET ENEMA 7-19 GM/118ML RE ENEM: 1.0000 | ENEMA | Freq: Once | RECTAL | Status: AC | PRN

## 2014-01-06 MED ORDER — TOBRAMYCIN SULFATE 1.2 G IJ SOLR
INTRAMUSCULAR | Status: AC
  Filled 2014-01-06: qty 1.2

## 2014-01-06 MED ORDER — BISACODYL 10 MG RE SUPP: 10.0000 mg | Freq: Every day | RECTAL | Status: DC | PRN

## 2014-01-06 MED ORDER — KCL IN DEXTROSE-NACL 20-5-0.45 MEQ/L-%-% IV SOLN
INTRAVENOUS | Status: DC
  Administered 2014-01-07 – 2014-01-10 (×4): via INTRAVENOUS
  Administered 2014-01-10: 75 mL/h via INTRAVENOUS
  Administered 2014-01-11: 09:00:00 via INTRAVENOUS
  Filled 2014-01-06 (×14): qty 1000

## 2014-01-06 MED ORDER — HYDROCODONE-ACETAMINOPHEN 5-325 MG PO TABS
1.0000 | ORAL_TABLET | ORAL | Status: DC | PRN
  Administered 2014-01-06: 2 via ORAL

## 2014-01-06 MED ORDER — METHOCARBAMOL 100 MG/ML IJ SOLN
500.0000 mg | Freq: Four times a day (QID) | INTRAVENOUS | Status: DC | PRN
  Filled 2014-01-06: qty 5

## 2014-01-06 MED ORDER — OXYCODONE HCL 5 MG PO TABS
5.0000 mg | ORAL_TABLET | Freq: Once | ORAL | Status: AC | PRN
  Administered 2014-01-06: 5 mg via ORAL

## 2014-01-06 MED ORDER — FENTANYL CITRATE 0.05 MG/ML IJ SOLN
INTRAMUSCULAR | Status: DC | PRN
  Administered 2014-01-06 (×5): 50 ug via INTRAVENOUS

## 2014-01-06 MED ORDER — ONDANSETRON HCL 4 MG/2ML IJ SOLN
INTRAMUSCULAR | Status: AC
  Filled 2014-01-06: qty 2

## 2014-01-06 MED ORDER — HYDROCODONE-ACETAMINOPHEN 5-325 MG PO TABS
ORAL_TABLET | ORAL | Status: AC
Start: 2014-01-06 — End: 2014-01-07
  Filled 2014-01-06: qty 2

## 2014-01-06 MED ORDER — PROPOFOL 10 MG/ML IV BOLUS
INTRAVENOUS | Status: AC
  Filled 2014-01-06: qty 20

## 2014-01-06 MED ORDER — ROCURONIUM BROMIDE 50 MG/5ML IV SOLN
INTRAVENOUS | Status: AC
Start: 2014-01-06 — End: 2014-01-06
  Filled 2014-01-06: qty 1

## 2014-01-06 MED ORDER — PHENYLEPHRINE HCL 10 MG/ML IJ SOLN
INTRAMUSCULAR | Status: DC | PRN
  Administered 2014-01-06: 40 ug via INTRAVENOUS

## 2014-01-06 MED ORDER — ZOLPIDEM TARTRATE 5 MG PO TABS: 5.0000 mg | ORAL_TABLET | Freq: Every evening | ORAL | Status: DC | PRN

## 2014-01-06 MED ORDER — DIPHENHYDRAMINE HCL 12.5 MG/5ML PO ELIX: 12.5000 mg | ORAL_SOLUTION | Freq: Four times a day (QID) | ORAL | Status: DC | PRN

## 2014-01-06 MED ORDER — SODIUM CHLORIDE 0.9 % IJ SOLN: 9.0000 mL | INTRAMUSCULAR | Status: DC | PRN

## 2014-01-06 MED ORDER — LIDOCAINE HCL (CARDIAC) 20 MG/ML IV SOLN
INTRAVENOUS | Status: DC | PRN
  Administered 2014-01-06: 80 mg via INTRAVENOUS

## 2014-01-06 MED ORDER — TOBRAMYCIN SULFATE 1.2 G IJ SOLR
INTRAMUSCULAR | Status: DC | PRN
  Administered 2014-01-06: 1.2 g

## 2014-01-06 MED ORDER — PIPERACILLIN-TAZOBACTAM 3.375 G IVPB
3.3750 g | Freq: Three times a day (TID) | INTRAVENOUS | Status: DC
  Administered 2014-01-06 – 2014-01-07 (×2): 3.375 g via INTRAVENOUS
  Filled 2014-01-06 (×5): qty 50

## 2014-01-06 MED ORDER — VANCOMYCIN HCL 1000 MG IV SOLR
INTRAVENOUS | Status: AC
Start: 2014-01-06 — End: 2014-01-06
  Filled 2014-01-06: qty 1000

## 2014-01-06 MED ORDER — METOCLOPRAMIDE HCL 10 MG PO TABS: 5.0000 mg | ORAL_TABLET | Freq: Three times a day (TID) | ORAL | Status: DC | PRN

## 2014-01-06 MED ORDER — METHOCARBAMOL 500 MG PO TABS
500.0000 mg | ORAL_TABLET | Freq: Four times a day (QID) | ORAL | Status: DC | PRN
  Administered 2014-01-07 – 2014-01-11 (×13): 500 mg via ORAL
  Filled 2014-01-06 (×17): qty 1

## 2014-01-06 MED ORDER — LACTATED RINGERS IV SOLN
INTRAVENOUS | Status: DC
  Administered 2014-01-06: 50 mL/h via INTRAVENOUS

## 2014-01-06 MED ORDER — NALOXONE HCL 0.4 MG/ML IJ SOLN
0.4000 mg | INTRAMUSCULAR | Status: DC | PRN
Start: 2014-01-06 — End: 2014-01-08

## 2014-01-06 MED ORDER — SODIUM CHLORIDE 0.9 % IR SOLN
Status: DC | PRN
  Administered 2014-01-06: 1000 mL

## 2014-01-06 MED ORDER — LIDOCAINE HCL (CARDIAC) 20 MG/ML IV SOLN
INTRAVENOUS | Status: AC
  Filled 2014-01-06: qty 5

## 2014-01-06 SURGICAL SUPPLY — 65 items
1.6 kwires ×8 IMPLANT
1.6 mini clamps ×4 IMPLANT
APL SKNCLS STERI-STRIP NONHPOA (GAUZE/BANDAGES/DRESSINGS) ×1
BANDAGE CONFORM 3  STR LF (GAUZE/BANDAGES/DRESSINGS) IMPLANT
BANDAGE ELASTIC 4 VELCRO ST LF (GAUZE/BANDAGES/DRESSINGS) IMPLANT
BANDAGE ELASTIC 6 VELCRO ST LF (GAUZE/BANDAGES/DRESSINGS) ×2 IMPLANT
BENZOIN TINCTURE PRP APPL 2/3 (GAUZE/BANDAGES/DRESSINGS) ×3 IMPLANT
BLADE AVERAGE 25MMX9MM (BLADE) ×1
BLADE AVERAGE 25X9 (BLADE) ×1 IMPLANT
BLADE SURG ROTATE 9660 (MISCELLANEOUS) IMPLANT
BNDG CMPR MD 5X2 ELC HKLP STRL (GAUZE/BANDAGES/DRESSINGS)
BNDG COHESIVE 1X5 TAN STRL LF (GAUZE/BANDAGES/DRESSINGS) IMPLANT
BNDG ELASTIC 2 VLCR STRL LF (GAUZE/BANDAGES/DRESSINGS) IMPLANT
BONE CEMENT PALACOSE (Orthopedic Implant) ×3 IMPLANT
CEMENT BONE PALACOSE (Orthopedic Implant) IMPLANT
CLAMP 3.0MM (Clamp) ×2 IMPLANT
CLOSURE WOUND 1/2 X4 (GAUZE/BANDAGES/DRESSINGS)
CLOTH BEACON ORANGE TIMEOUT ST (SAFETY) ×3 IMPLANT
CORDS BIPOLAR (ELECTRODE) ×3 IMPLANT
COVER SURGICAL LIGHT HANDLE (MISCELLANEOUS) ×3 IMPLANT
CUFF TOURNIQUET SINGLE 18IN (TOURNIQUET CUFF) IMPLANT
CUFF TOURNIQUET SINGLE 24IN (TOURNIQUET CUFF) IMPLANT
DRSG EMULSION OIL 3X3 NADH (GAUZE/BANDAGES/DRESSINGS) IMPLANT
DRSG PAD ABDOMINAL 8X10 ST (GAUZE/BANDAGES/DRESSINGS) ×2 IMPLANT
DURAPREP 26ML APPLICATOR (WOUND CARE) ×3 IMPLANT
ELECT REM PT RETURN 9FT ADLT (ELECTROSURGICAL) ×3
ELECTRODE REM PT RTRN 9FT ADLT (ELECTROSURGICAL) IMPLANT
GAUZE SPONGE 2X2 8PLY STRL LF (GAUZE/BANDAGES/DRESSINGS) IMPLANT
GAUZE XEROFORM 1X8 LF (GAUZE/BANDAGES/DRESSINGS) IMPLANT
GAUZE XEROFORM 5X9 LF (GAUZE/BANDAGES/DRESSINGS) ×2 IMPLANT
GLOVE BIOGEL PI IND STRL 8 (GLOVE) ×1 IMPLANT
GLOVE BIOGEL PI INDICATOR 8 (GLOVE) ×2
GLOVE ORTHO TXT STRL SZ7.5 (GLOVE) ×3 IMPLANT
GOWN PREVENTION PLUS LG XLONG (DISPOSABLE) ×2 IMPLANT
GOWN STRL NON-REIN LRG LVL3 (GOWN DISPOSABLE) ×4 IMPLANT
GOWN STRL REUS W/ TWL LRG LVL3 (GOWN DISPOSABLE) IMPLANT
GOWN STRL REUS W/TWL LRG LVL3 (GOWN DISPOSABLE) ×12
K-WIRE THREADED TIP 1.6 (WIRE) ×8 IMPLANT
KIT BASIN OR (CUSTOM PROCEDURE TRAY) ×3 IMPLANT
KIT ROOM TURNOVER OR (KITS) ×3 IMPLANT
MANIFOLD NEPTUNE II (INSTRUMENTS) ×3 IMPLANT
NS IRRIG 1000ML POUR BTL (IV SOLUTION) ×3 IMPLANT
PACK ORTHO EXTREMITY (CUSTOM PROCEDURE TRAY) ×3 IMPLANT
PAD ARMBOARD 7.5X6 YLW CONV (MISCELLANEOUS) ×4 IMPLANT
ROD CARBON FIBER 3.0 (ROD) ×4 IMPLANT
SPECIMEN JAR SMALL (MISCELLANEOUS) ×3 IMPLANT
SPONGE GAUZE 2X2 STER 10/PKG (GAUZE/BANDAGES/DRESSINGS)
SPONGE GAUZE 4X4 12PLY (GAUZE/BANDAGES/DRESSINGS) ×2 IMPLANT
STRIP CLOSURE SKIN 1/2X4 (GAUZE/BANDAGES/DRESSINGS) ×1 IMPLANT
SUCTION FRAZIER TIP 10 FR DISP (SUCTIONS) ×2 IMPLANT
SUT ETHIBOND 4 0 TF (SUTURE) IMPLANT
SUT ETHIBOND 5 0 P 3 (SUTURE)
SUT ETHILON 4 0 P 3 18 (SUTURE) IMPLANT
SUT ETHILON 5 0 P 3 18 (SUTURE)
SUT MERSILENE 6 0 S14 DA (SUTURE) IMPLANT
SUT NYLON ETHILON 5-0 P-3 1X18 (SUTURE) IMPLANT
SUT POLY ETHIBOND 5-0 P-3 1X18 (SUTURE) IMPLANT
SUT PROLENE 4 0 P 3 18 (SUTURE) IMPLANT
SUT SILK 4 0 PS 2 (SUTURE) ×2 IMPLANT
TOWEL OR 17X24 6PK STRL BLUE (TOWEL DISPOSABLE) ×3 IMPLANT
TOWEL OR 17X26 10 PK STRL BLUE (TOWEL DISPOSABLE) ×3 IMPLANT
TUBE CONNECTING 12'X1/4 (SUCTIONS) ×1
TUBE CONNECTING 12X1/4 (SUCTIONS) ×1 IMPLANT
WATER STERILE IRR 1000ML POUR (IV SOLUTION) ×1 IMPLANT
carbon fiber rods ×2 IMPLANT

## 2014-01-06 NOTE — Anesthesia Postprocedure Evaluation (Signed)
Anesthesia Post Note  Patient: [name]  Procedure(s) Performed: Procedure(s) (LRB): Left Foot 1st MTP I&D with Bone Resection, and Antibiotic Cement (Left)  Anesthesia type: General  Patient location: PACU  Post pain: Pain level controlled and Adequate analgesia  Post assessment: Post-op Vital signs reviewed, Patient's Cardiovascular Status Stable, Respiratory Function Stable, Patent Airway and Pain level controlled  Last Vitals:  Filed Vitals:   01/06/14 1928  BP:   Pulse: 85  Temp:   Resp: 13    Post vital signs: Reviewed and stable  Level of consciousness: awake, alert  and oriented  Complications: No apparent anesthesia complications

## 2014-01-06 NOTE — H&P (Signed)
[name] is an 28 y.o. male.   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   HPI: 28yo male Sickle cell trait positive with OTJI 09/04/13 with crush injury between moving forklift and metal mold . [name] suffered severe left foot forefoot crush injury with multiple lacerations, open fracture 1st MT Fx. 1st MTP dislocation, IP dislocation, 3rd MT fx, comminuted 5th MT fx's and 5th toe phalanx fx.   Due to lacerations and fractures his compartments were decompressed at the time of the injury and [name] did not have compartment syndrome involvement in the foot. [name] underwent excisional debridement of multiple open injuries, pin fixation, closure of multiple webspace lacerations on 09/04/13.   Repeat surgery with dressing change and plate fixation  09/07/13 of 1st MT shaft fx with pinning of reduced IP and MP joints of great toe.  Post op xrays looked good for several months, k-wire was removed in the office and 2 months later [name] developed erosive changes of 1st MTP joint with medial drainage that started 4 days ago. [name] was placed on doxycycline  100mg  po bid 12/31/13 by Dr. Roda ShuttersXu my partner with pinpoint drainage from medial 1st MTP joint. [name] had healed dorsal foot blisters and medial incision was healed for 2 months and was ambulatory. With drainage [name] had increased pain and went to [facility].  xrays in office 01/05/14 showed loss of joint space and bone erosion on both sides of 1st MTP joint.  Concern that plated 1st MTP shaft had not healed and with partial 2mm backup of 2 screws [name] is now brought back for debridement of infected bone , removal of plate,  External fixation as needed and IV ABX, PICC line for home ABX.    Past Medical History  Diagnosis Date  . Sickle cell trait     Past Surgical History  Procedure Laterality Date  . I&d extremity Left 09/04/2013    Procedure: IRRIGATION AND  DEBRIDEMENT EXTREMITY;  Surgeon: Eldred MangesMark C Lilyann Gravelle, MD;  [facility];  Service: Orthopedics;  Laterality: Left;  . Percutaneous pinning Left 09/04/2013    Procedure: PERCUTANEOUS PINNING  LEFT FOOT;  Surgeon: Eldred MangesMark C Mariana Wiederholt, MD;  Location: [facility] OR;  Service: Orthopedics;  Laterality: Left;  . I&d extremity Left 09/07/2013    Procedure: IRRIGATION AND DEBRIDEMENT EXTREMITY;  Surgeon: Eldred MangesMark C Judd Mccubbin, MD;  Location: [facility] OR;  Service: Orthopedics;  Laterality: Left;  . Orif toe fracture Left 09/07/2013    Procedure: OPEN REDUCTION INTERNAL FIXATION (ORIF)  GREAT TOE IP JOINT DISLOCATION, ORIF 1ST MTP JOINT DISLOCATION AND ORIF 1ST METATARSAL SHAFT FRACTURE.;  Surgeon: Eldred MangesMark C Cortana Vanderford, MD;  Location: [facility] OR;  Service: Orthopedics;  Laterality: Left;    History reviewed. No pertinent family history. Social History:  reports that [name] has been smoking.  [name] does not have any smokeless tobacco history on file. [name] reports that [name] drinks alcohol. [name] reports that [name] does not use illicit drugs.  Allergies: No Known Allergies  No prescriptions prior to admission    No results found for this or any previous visit (from the past 48 hour(s)). No results found.  Review of Systems  Constitutional: Negative for fever, chills and weight loss.  HENT: Negative for ear pain.   Eyes: Negative for blurred vision.  Respiratory: Negative.   Cardiovascular: Negative for chest pain, palpitations and leg swelling.  Gastrointestinal: Negative.   Genitourinary: Negative.   Musculoskeletal:  Positive for joint pain.       Increased pain 1st left MTP joint and foot with weight bearing  Skin: Negative for itching and rash.  Neurological: Negative for headaches.  Endo/Heme/Allergies:       Sickle cell trait   Psychiatric/Behavioral: Negative.     There were no vitals taken for this visit. Physical Exam  Constitutional: [name] is oriented to person, place, and time. [name] appears well-developed and well-nourished.  HENT:  Head:  Normocephalic and atraumatic.  Right Ear: External ear normal.  Left Ear: External ear normal.  Eyes: Conjunctivae and EOM are normal. Pupils are equal, round, and reactive to light.  Neck: Normal range of motion. Neck supple.  Cardiovascular: Normal rate, regular rhythm and normal heart sounds.   Respiratory: Effort normal and breath sounds normal.  GI: Soft. Bowel sounds are normal.  Musculoskeletal:  Left forefoot edema, depigmentation adjacent to medial 1st MTP incision which is healed proximally and 1 mm medial 1st MTP joint sinus with trace serosang. Drainage on sock after 24 hrs of wear with 2mm spot on sock. Pain with 1st MTP ROM limited to 15 to 20 degrees. Good capillary refill of all toes.   Neurological: [name] is alert and oriented to person, place, and time.  Skin: Skin is warm. No rash noted. No erythema.  Psychiatric: [name] has a normal mood and affect. His behavior is normal.     Assessment/Plan Crush injury left foot, S/P ORIF 1st left MT shaft with late left 1st MTP joint infection and osteomyelitis. Plan excision debridement of infected bone, cultures intra-op  THEN IV ABX,  Removal of medial 1st MTP plate, external fixator to maintain length of 1st ray, absorbable antibiotic beads.  [name] will likely get [facility] line post op . Infectious disease consultation ,  HH IV ABX will be arranged post op based on cultures. [name] may need more than one trip to the OR and later once infection cleared plan will be 1st MTP joint fusion with bone grafting to fill the defect.   Extensive discussion with the patient and his Mother was held on   01/05/14  In my office. Risks of surgery , possibility that infection cannot be irradicated , reoperation, external fixator loosening, possibility of eventual 1st ray amputation if unsuccessful in getting rid of bone infection were included in the discussion, ?'s were encouraged and all were answered, they agree and desire to proceed.   Imir Brumbach C 01/06/2014, 7:45  AM

## 2014-01-06 NOTE — Anesthesia Procedure Notes (Signed)
Procedure Name: LMA Insertion Date/Time: 01/06/2014 4:11 PM Performed by: Ellin GoodieWEAVER, Kiyo Heal M Pre-anesthesia Checklist: Patient identified, Emergency Drugs available, Suction available, Patient being monitored and Timeout performed Patient Re-evaluated:Patient Re-evaluated prior to inductionPreoxygenation: Pre-oxygenation with 100% oxygen Intubation Type: IV induction Ventilation: Mask ventilation without difficulty LMA: LMA inserted LMA Size: 5.0 Number of attempts: 1 Placement Confirmation: breath sounds checked- equal and bilateral and positive ETCO2 Tube secured with: Tape Dental Injury: Teeth and Oropharynx as per pre-operative assessment  Comments: Easy induction and LMA insertion.  Dr. Katrinka BlazingSmith verified placement.

## 2014-01-06 NOTE — Interval H&P Note (Signed)
History and Physical Interval Note:  01/06/2014 3:37 PM  [name]  has presented today for surgery, with the diagnosis of Left Foot MTP Joint  The various methods of treatment have been discussed with the patient and family. After consideration of risks, benefits and other options for treatment, the patient has consented to  Procedure(s): Left Foot 1st MTP Drainage with Bone Resection, and Antibiotic Cement (Left) as a surgical intervention .  The patient's history has been reviewed, patient examined, no change in status, stable for surgery.  I have reviewed the patient's chart and labs.  Questions were answered to the patient's satisfaction.     Steadman Prosperi C

## 2014-01-06 NOTE — Brief Op Note (Signed)
01/06/2014  5:35 PM  PATIENT:  [name]  28 y.o. male  PRE-OPERATIVE DIAGNOSIS:  Left Foot MTP Joint osteomyelitis  POST-OPERATIVE DIAGNOSIS:  Left Foot MTP Joint osteomyelitis  PROCEDURE:  Procedure(s): Left Foot 1st MTP I&D with Bone Resection, and Antibiotic Cement (Left) External fixator left 1st MT  SURGEON:  Surgeon(s) and Role:    * Eldred MangesMark C Yates, MD - Primary  PHYSICIAN ASSISTANT: Maurie BoettcherSheila VernonPAC  ASSISTANTS: none   ANESTHESIA:   general  EBL:  Total I/O In: 1200 [I.V.:1200] Out: -   BLOOD ADMINISTERED:none  DRAINS: none   LOCAL MEDICATIONS USED:  NONE  SPECIMEN:  Source of Specimen:  left first MTP joint and fx site of first MT  DISPOSITION OF SPECIMEN:  microbiology and pathology  COUNTS:  YES  TOURNIQUET:  * Missing tourniquet times found for documented tourniquets in log:  [id number] *  DICTATION: .Note written in EPIC  PLAN OF CARE: Admit to inpatient   PATIENT DISPOSITION:  PACU - hemodynamically stable.   Delay start of Pharmacological VTE agent (>24hrs) due to surgical blood loss or risk of bleeding: yes

## 2014-01-06 NOTE — Progress Notes (Signed)
ANTIBIOTIC CONSULT NOTE - INITIAL  Pharmacy Consult for Vancomycin, Zosyn Indication: Osteomyelitis   Allergies  Allergen Reactions  . Other     "lacrilube" Caused redness     Patient Measurements: Height: 5\' 5"  (165.1 cm) Weight: 158 lb 8.2 oz (71.9 kg) IBW/kg (Calculated) : 61.5   Labs:  Recent Labs  01/06/14 1137  WBC 4.8  HGB 16.0  PLT 193  CREATININE 1.04    Microbiology: No results found for this or any previous visit (from the past 720 hour(s)).  Medical History: Past Medical History  Diagnosis Date  . Sickle cell trait     Assessment: 28 year old male s/p I+D with bone resection of left first MTP to begin IV Zosyn and Vancomycin.  Scr stable.  Goal of Therapy:  Vancomycin trough level 15-20 mcg/ml Appropriate Zosyn dosing  Plan:  1) Zosyn 3.375 grams iv Q 8 hours - 4 hr infusion 2) Vancomycin 750 mg iv Q 8 hours 3) Follow up Scr, cultures, plan  Thank you. Okey RegalLisa Hermie Reagor, PharmD [phone]  Elwin SleightPowell, Ladina Shutters Kay 01/06/2014,8:05 PM

## 2014-01-06 NOTE — Preoperative (Signed)
Beta Blockers   Reason not to administer Beta Blockers:Not Applicable 

## 2014-01-06 NOTE — Anesthesia Preprocedure Evaluation (Addendum)
Anesthesia Evaluation  Patient identified by MRN, date of birth, ID band Patient awake    Reviewed: Allergy & Precautions, H&P , NPO status , Patient's Chart, lab work & pertinent test results, reviewed documented beta blocker date and time   Airway Mallampati: I TM Distance: >3 FB Neck ROM: Full    Dental  (+) Teeth Intact and Dental Advisory Given   Pulmonary Current Smoker,  breath sounds clear to auscultation        Cardiovascular negative cardio ROS  Rhythm:Regular     Neuro/Psych    GI/Hepatic negative GI ROS, Neg liver ROS,   Endo/Other  negative endocrine ROS  Renal/GU negative Renal ROS     Musculoskeletal negative musculoskeletal ROS (+)   Abdominal (+)  Abdomen: soft. Bowel sounds: normal.  Peds  Hematology negative hematology ROS (+)   Anesthesia Other Findings   Reproductive/Obstetrics negative OB ROS                         Anesthesia Physical Anesthesia Plan  ASA: I  Anesthesia Plan:    Post-op Pain Management:    Induction:   Airway Management Planned:   Additional Equipment:   Intra-op Plan:   Post-operative Plan:   Informed Consent:   Plan Discussed with:   Anesthesia Plan Comments:         Anesthesia Quick Evaluation

## 2014-01-06 NOTE — Transfer of Care (Signed)
Immediate Anesthesia Transfer of Care Note  Patient: [name]  Procedure(s) Performed: Procedure(s): Left Foot 1st MTP I&D with Bone Resection, and Antibiotic Cement (Left)  Patient Location: PACU  Anesthesia Type:General  Level of Consciousness: awake, alert , oriented and sedated  Airway & Oxygen Therapy: Patient connected to face mask oxygen  Post-op Assessment: Report given to PACU RN  Post vital signs: stable  Complications: No apparent anesthesia complications

## 2014-01-07 DIAGNOSIS — M86179 Other acute osteomyelitis, unspecified ankle and foot: Principal | ICD-10-CM

## 2014-01-07 MED ORDER — SODIUM CHLORIDE 0.9 % IJ SOLN
10.0000 mL | INTRAMUSCULAR | Status: DC | PRN
  Administered 2014-01-08: 10 mL

## 2014-01-07 MED ORDER — LEVOFLOXACIN 750 MG PO TABS
750.0000 mg | ORAL_TABLET | Freq: Every day | ORAL | Status: DC
  Administered 2014-01-07 – 2014-01-08 (×2): 750 mg via ORAL
  Filled 2014-01-07 (×2): qty 1

## 2014-01-07 NOTE — Progress Notes (Signed)
Dressing change and pin site care per order this a.m. -patient's mother adverse to doing dressing changes at home- she was present and given instruction. MD made aware..Marland Kitchen

## 2014-01-07 NOTE — Progress Notes (Signed)
ANTIBIOTIC CONSULT NOTE - Follow up  Pharmacy Consult for Vancomycin; Zosyn>>Levaquin  Indication: Osteomyelitis   Allergies  Allergen Reactions  . Other     "lacrilube" Caused redness     Patient Measurements: Height: 5\' 5"  (165.1 cm) Weight: 158 lb 8.2 oz (71.9 kg) IBW/kg (Calculated) : 61.5   Labs:  Recent Labs  01/06/14 1137  WBC 4.8  HGB 16.0  PLT 193  CREATININE 1.04     Assessment: 28 year old male s/p I+D with bone resection of left first MTP was started on IV Zosyn and Vancomycin.  Per ID recommendation, changing IV zosyn to PO levaquin. Bmet wnl. CrCl ~ 94.5 mL/min. All cultures pending.   Goal of Therapy:  Vancomycin trough level 15-20 mcg/ml  Plan:  1) Stop Zosyn 2) Vancomycin 750 mg iv Q 8 hours 3) Start Levaquin 750 mg by mouth daily  4) Follow up Scr, cultures, plan 5) Vanc trough tomorrow    Vinnie LevelBenjamin Tadd Holtmeyer, PharmD.  Clinical Pharmacist Pager [phone]

## 2014-01-07 NOTE — Progress Notes (Signed)
Orthopedic Tech Progress Note Patient Details:  [name] 02-26-1986 [id number]  Ortho Devices Type of Ortho Device: Darco shoe Ortho Device/Splint Interventions: Application   Cammer, Mickie BailJennifer Carol 01/07/2014, 9:10 AM

## 2014-01-07 NOTE — Care Management Note (Signed)
CARE MANAGEMENT NOTE 01/07/2014  Patient:  [name],[name]   Account Number:  [id number]  Date Initiated:  01/07/2014  Documentation initiated by:  Vance PeperBRADY,Birch Farino  Subjective/Objective Assessment:   28 yr old male admitted with Left foot MTP joint osteo. s/p Left foot I & D     Action/Plan:   Patient will need IV antibiotics at home. PICC to be placed.Patient is under worker's comp. CM will contact worker's comp.   Anticipated DC Date:     Anticipated DC Plan:  HOME W HOME HEALTH SERVICES         Choice offered to / List presented to:             Status of service:  In process, will continue to follow

## 2014-01-07 NOTE — Progress Notes (Signed)
Subjective: 1 Day Post-Op Procedure(s) (LRB): Left Foot 1st MTP I&D with Bone Resection, and Antibiotic Cement (Left) Patient reports pain as 8 on 0-10 scale.    Objective: Vital signs in last 24 hours: Temp:  [97.6 F (36.4 C)-98.6 F (37 C)] 98.6 F (37 C) (01/29 0624) Pulse Rate:  [60-94] 82 (01/29 0624) Resp:  [8-26] 12 (01/29 0634) BP: (106-142)/(54-98) 125/98 mmHg (01/29 0624) SpO2:  [93 %-100 %] 99 % (01/29 0634) Weight:  [71.9 kg (158 lb 8.2 oz)] 71.9 kg (158 lb 8.2 oz) (01/28 1121)  Intake/Output from previous day: 01/28 0701 - 01/29 0700 In: 1365 [I.V.:1265; IV Piggyback:100] Out: 350 [Urine:350] Intake/Output this shift:     Recent Labs  01/06/14 1137  HGB 16.0    Recent Labs  01/06/14 1137  WBC 4.8  RBC 5.66  HCT 46.3  PLT 193    Recent Labs  01/06/14 1137  NA 140  K 3.9  CL 102  CO2 26  BUN 12  CREATININE 1.04  GLUCOSE 88  CALCIUM 9.5    Recent Labs  01/06/14 1137  INR 1.00    dressing dry.   Assessment/Plan: 1 Day Post-Op Procedure(s) (LRB): Left Foot 1st MTP I&D with Bone Resection, and Antibiotic Cement (Left) Up with therapy  Darco shoe. PIC line, ID consult and start pin tract care    YATES,MARK C 01/07/2014, 7:45 AM

## 2014-01-07 NOTE — Progress Notes (Signed)
Peripherally Inserted Central Catheter/Midline Placement  The IV Nurse has discussed with the patient and/or persons authorized to consent for the patient, the purpose of this procedure and the potential benefits and risks involved with this procedure.  The benefits include less needle sticks, lab draws from the catheter and patient may be discharged home with the catheter.  Risks include, but not limited to, infection, bleeding, blood clot (thrombus formation), and puncture of an artery; nerve damage and irregular heat beat.  Alternatives to this procedure were also discussed.  PICC/Midline Placement Documentation        Elliot DallyRiggs, Hasina Kreager Wright 01/07/2014, 1:48 PM

## 2014-01-07 NOTE — Consult Note (Signed)
[facility]     Reason for Consult: osteomyelitis    Referring Physician: Dr. Ophelia Charter  Active Problems:   Acute osteomyelitis of metatarsal bone of left foot   . aspirin EC  325 mg Oral Daily  . docusate sodium  100 mg Oral BID  . HYDROcodone-acetaminophen      . HYDROmorphone PCA 0.3 mg/mL   Intravenous Q4H  . piperacillin-tazobactam (ZOSYN)  IV  3.375 g Intravenous Q8H  . vancomycin  750 mg Intravenous Q8H    Recommendations: Vancomycin  I will change Zosyn to levaquin oral Will wait for culture but no organism on gram stain and was on doxy so less likely to grow.   PICC ordered   Assessment: He has osteomyelitis based on operative findings.     Antibiotics: Vancomycin and Zosyn  HPI: Carrson L Osley is a 28 y.o. male with left foot crush injury 09/04/13, open, between forklift and metal mold.  He had 1st MTP dislocation, IP dislocation, 3rd MT fracture, comminuted 5th MT fracture and 5th toe phalanx fracture.  He required pin fixation, plate fixation and was doing well but developed erosive changes 2 months later with drainage.  Xray noted loosening and erosion and started on doxycycline.  He came in for debridement last night by Dr. Ophelia Charter.  No fever or chills.  Has multiple questions regarding the antibiotics and culture.     Review of Systems: A comprehensive review of systems was negative.  Past Medical History  Diagnosis Date  . Sickle cell trait     History  Substance Use Topics  . Smoking status: Current Every Day Smoker -- 0.01 packs/day for 3 years  . Smokeless tobacco: Not on file  . Alcohol Use: Yes     Comment: denies    History reviewed. No pertinent family history. Allergies  Allergen Reactions  . Other     "lacrilube" Caused redness     OBJECTIVE: Blood pressure 125/98, pulse 82, temperature 98.6 F (37 C), temperature source Oral, resp. rate 12, height 5\' 5"  (1.651 m), weight 158 lb 8.2 oz (71.9 kg), SpO2  99.00%. General: Awake, alert Skin: no rashes Lungs: CTA B Cor: RRR without m Abdomen: soft, nt, nd Ext: wrapped  Microbiology: Recent Results (from the past 240 hour(s))  WOUND CULTURE     Status: None   Collection Time    01/06/14  4:36 PM      Result Value Range Status   Specimen Description WOUND TOE LEFT   Final   Special Requests SAMPLE NO 1   Final   Gram Stain     Final   Value: NO WBC SEEN     NO SQUAMOUS EPITHELIAL CELLS SEEN     NO ORGANISMS SEEN     Performed at [facility]   Culture     Final   Value: NO GROWTH     Performed at [facility]   Report Status PENDING   Incomplete  ANAEROBIC CULTURE     Status: None   Collection Time    01/06/14  4:36 PM      Result Value Range Status   Specimen Description WOUND TOE LEFT   Final   Special Requests SAMPLE NO 1   Final   Gram Stain     Final   Value: NO WBC SEEN     NO SQUAMOUS EPITHELIAL CELLS SEEN     NO ORGANISMS SEEN     Performed at  [facility]   Culture PENDING   Incomplete   Report Status PENDING   Incomplete  WOUND CULTURE     Status: None   Collection Time    01/06/14  4:38 PM      Result Value Range Status   Specimen Description WOUND TOE LEFT   Final   Special Requests SAMPLE NO 2   Final   Gram Stain     Final   Value: NO WBC SEEN     NO SQUAMOUS EPITHELIAL CELLS SEEN     NO ORGANISMS SEEN     [facility]formed at [facility]   Culture     Final   Value: NO GROWTH 1 DAY     [facility]   Report Status PENDING   Incomplete    COMER, Molly MaduroOBERT, MD [facility] [email or web address] [phone] pager  [phone] cell 01/07/2014, 8:40 AM

## 2014-01-07 NOTE — Progress Notes (Signed)
Utilization review completed. Nevena Rozenberg, RN, BSN. 

## 2014-01-08 DIAGNOSIS — M869 Osteomyelitis, unspecified: Secondary | ICD-10-CM

## 2014-01-08 DIAGNOSIS — B965 Pseudomonas (aeruginosa) (mallei) (pseudomallei) as the cause of diseases classified elsewhere: Secondary | ICD-10-CM | POA: Diagnosis not present

## 2014-01-08 LAB — VANCOMYCIN, TROUGH: Vancomycin Tr: 9 ug/mL — ABNORMAL LOW (ref 10.0–20.0)

## 2014-01-08 MED ORDER — VANCOMYCIN HCL 10 G IV SOLR
1250.0000 mg | Freq: Three times a day (TID) | INTRAVENOUS | Status: DC
  Administered 2014-01-08: 1250 mg via INTRAVENOUS
  Filled 2014-01-08 (×3): qty 1250

## 2014-01-08 MED ORDER — FLUOXETINE HCL 20 MG PO CAPS
20.0000 mg | ORAL_CAPSULE | Freq: Every day | ORAL | Status: DC
Start: 2014-01-08 — End: 2014-01-11
  Administered 2014-01-08 – 2014-01-11 (×4): 20 mg via ORAL
  Filled 2014-01-08 (×4): qty 1

## 2014-01-08 MED ORDER — KETOROLAC TROMETHAMINE 30 MG/ML IJ SOLN
30.0000 mg | Freq: Four times a day (QID) | INTRAMUSCULAR | Status: DC | PRN
  Administered 2014-01-08 – 2014-01-10 (×2): 30 mg via INTRAVENOUS
  Filled 2014-01-08: qty 1

## 2014-01-08 MED ORDER — DEXTROSE 5 % IV SOLN
2.0000 g | INTRAVENOUS | Status: DC
  Administered 2014-01-08 – 2014-01-11 (×4): 2 g via INTRAVENOUS
  Filled 2014-01-08 (×6): qty 2

## 2014-01-08 MED ORDER — OXYCODONE HCL 5 MG PO TABS
10.0000 mg | ORAL_TABLET | ORAL | Status: DC | PRN
  Administered 2014-01-08 – 2014-01-11 (×23): 10 mg via ORAL
  Filled 2014-01-08 (×23): qty 2

## 2014-01-08 MED ORDER — KETOROLAC TROMETHAMINE 30 MG/ML IJ SOLN
INTRAMUSCULAR | Status: AC
  Filled 2014-01-08: qty 1

## 2014-01-08 MED ORDER — GABAPENTIN 100 MG PO CAPS
100.0000 mg | ORAL_CAPSULE | Freq: Three times a day (TID) | ORAL | Status: DC
  Administered 2014-01-08 – 2014-01-11 (×8): 100 mg via ORAL
  Filled 2014-01-08 (×10): qty 1

## 2014-01-08 NOTE — Care Management Note (Signed)
01/08/14  Vance PeperSusan Anaiyah Anglemyer, RN BSN Case Manager [phone] Patient's adjuster is  Deeann Creearoline Fix [phone], Fax [phone]. Faxed order for home Health RN to her. [facility] will handle arranging home health, should patient discharge over weekend orders are to be faxed to [phone] and call [phone].

## 2014-01-08 NOTE — Progress Notes (Signed)
Patient ID: [name], male   DOB: 04-28-1986, 28 y.o.   MRN: [id number] Long discussion with patient. He is focused that his foot is swollen and does not look like his other foot. Extensive discussion with patient and mother about why and what we are doing.  We have talked with him over and over about this . I have offered transfer of care and/or second opinion.  I discussed case with Mardene SpeakHandy, Dean, and Dr. Lajoyce Cornersuda pre op and patient and his Mother both know about this.  They stated to PA that they did not know what was going to be done.    He does not want to transfer care.      Waiting on IV ABX decision to be made as culture comes back.

## 2014-01-08 NOTE — Progress Notes (Signed)
[facility]  Date of Admission:  01/06/2014  Antibiotics: Antibiotics Given (last 72 hours)   Date/Time Action Medication Dose Rate   01/06/14 1655 Given  [Mixed in Bone cement with 1.2gm Tobramycin]   vancomycin (VANCOCIN) powder 1,000 mg    01/06/14 1655 Given  [used in bone cement with 1g Vancomycin]   tobramycin (NEBCIN) powder 1.2 g    01/06/14 2205 Given   piperacillin-tazobactam (ZOSYN) IVPB 3.375 g 3.375 g 12.5 mL/hr   01/06/14 2206 Given   vancomycin (VANCOCIN) IVPB 750 mg/150 ml premix 750 mg 150 mL/hr   01/07/14 0410 Given   piperacillin-tazobactam (ZOSYN) IVPB 3.375 g 3.375 g 12.5 mL/hr   01/07/14 0410 Given   vancomycin (VANCOCIN) IVPB 750 mg/150 ml premix 750 mg 150 mL/hr   01/07/14 1402 Given   vancomycin (VANCOCIN) IVPB 750 mg/150 ml premix 750 mg 150 mL/hr   01/07/14 1403 Given   levofloxacin (LEVAQUIN) tablet 750 mg 750 mg    01/07/14 2109 Given   vancomycin (VANCOCIN) IVPB 750 mg/150 ml premix 750 mg 150 mL/hr   01/08/14 0631 Given   vancomycin (VANCOCIN) 1,250 mg in sodium chloride 0.9 % 250 mL IVPB 1,250 mg 166.7 mL/hr   01/08/14 1206 Given   levofloxacin (LEVAQUIN) tablet 750 mg 750 mg       Subjective: Patient upset with condition of foot, growing GNR  Objective: Temp:  [98.1 F (36.7 C)-98.7 F (37.1 C)] 98.6 F (37 C) (01/30 0631) Pulse Rate:  [82-88] 82 (01/30 0631) Resp:  [12-20] 20 (01/30 0631) BP: (115-125)/(69-80) 125/80 mmHg (01/30 0631) SpO2:  [96 %-100 %] 98 % (01/30 0631)  General: awake, alert, nad Skin: no rashes Ext: foot with external fixator  Lab Results Lab Results  Component Value Date   WBC 4.8 01/06/2014   HGB 16.0 01/06/2014   HCT 46.3 01/06/2014   MCV 81.8 01/06/2014   PLT 193 01/06/2014    Lab Results  Component Value Date   CREATININE 1.04 01/06/2014   BUN 12 01/06/2014   NA 140 01/06/2014   K 3.9 01/06/2014   CL 102 01/06/2014   CO2 26 01/06/2014    Lab Results  Component Value  Date   ALT 15 01/06/2014   AST 15 01/06/2014   ALKPHOS 68 01/06/2014   BILITOT 0.8 01/06/2014      Microbiology: Recent Results (from the past 240 hour(s))  WOUND CULTURE     Status: None   Collection Time    01/06/14  4:36 PM      Result Value Range Status   Specimen Description WOUND TOE LEFT   Final   Special Requests SAMPLE NO 1   Final   Gram Stain     Final   Value: NO WBC SEEN     NO SQUAMOUS EPITHELIAL CELLS SEEN     NO ORGANISMS SEEN     Performed at [facility]   Culture     Final   Value: NO GROWTH 2 DAYS     Performed at [facility]   Report Status PENDING   Incomplete  ANAEROBIC CULTURE     Status: None   Collection Time    01/06/14  4:36 PM      Result Value Range Status   Specimen Description WOUND TOE LEFT   Final   Special Requests SAMPLE NO 1   Final   Gram Stain     Final   Value: NO WBC SEEN  NO SQUAMOUS EPITHELIAL CELLS SEEN     NO ORGANISMS SEEN     Performed at [facility]   Culture     Final   Value: NO ANAEROBES ISOLATED; CULTURE IN PROGRESS FOR 5 DAYS     Performed at [facility]   Report Status PENDING   Incomplete  WOUND CULTURE     Status: None   Collection Time    01/06/14  4:38 PM      Result Value Range Status   Specimen Description WOUND TOE LEFT   Final   Special Requests SAMPLE NO 2   Final   Gram Stain     Final   Value: NO WBC SEEN     NO SQUAMOUS EPITHELIAL CELLS SEEN     NO ORGANISMS SEEN     Performed at [facility]   Culture     Final   Value: RARE GRAM NEGATIVE RODS     Performed at [facility]   Report Status PENDING   Incomplete    Studies/Results: No results found.  Assessment/Plan: 1)  osteomyeltitis post traumatic from crush injury - culture now growing GNR, awaiting speciation and sensitivities.  I will change empirically to ceftriaxone.  Has picc.  It is possible to use oral antibiotics if levaquin sensitive or a shorter course of IV antibiotics and change  to oral levaquin but will depend on sensitivities.  Will need 6 weeks of coverage at least.  Baseline ESR and CRP normal.    COMER, [name], [facility] [email or web address] [phone] pager   [phone] cell 01/08/2014, 1:27 PM

## 2014-01-08 NOTE — Progress Notes (Signed)
Subjective: 2 Days Post-Op Procedure(s) (LRB): Left Foot 1st MTP I&D with Bone Resection, and Antibiotic Cement (Left) Patient reports pain as severe.   Pt very upset and angry this am stating he was not expecting this when he woke up.  States he was lied to about his foot as he was told he would have "95% function of his foot"  Wants it "fixed now".  His toe looks' wrong' and he should not have this much swelling and pain. Asking to see Dr Ophelia CharterYates and doesn't want to further discuss with me.  Discussed at length with pt and his mother the procedures performed and the reasons why.  Discussed further treatment and healing as far as the cosmetic appearance of his foot.  Discussed need for antibiotics and external fixator. Also discussed need to DC PCA pump and use po meds which he adamantly refuses to do.   Discussed this visit and content with Dr Ophelia CharterYates who will see the pt later today.  Objective: Vital signs in last 24 hours: Temp:  [98.1 F (36.7 C)-98.7 F (37.1 C)] 98.6 F (37 C) (01/30 0631) Pulse Rate:  [82-88] 82 (01/30 0631) Resp:  [12-20] 20 (01/30 0631) BP: (115-125)/(69-80) 125/80 mmHg (01/30 0631) SpO2:  [96 %-100 %] 98 % (01/30 0631)  Intake/Output from previous day: 01/29 0701 - 01/30 0700 In: 2986.3 [P.O.:760; I.V.:1976.3; IV Piggyback:250] Out: 650 [Urine:650] Intake/Output this shift: Total I/O In: 240 [P.O.:240] Out: -    Recent Labs  01/06/14 1137  HGB 16.0    Recent Labs  01/06/14 1137  WBC 4.8  RBC 5.66  HCT 46.3  PLT 193    Recent Labs  01/06/14 1137  NA 140  K 3.9  CL 102  CO2 26  BUN 12  CREATININE 1.04  GLUCOSE 88  CALCIUM 9.5    Recent Labs  01/06/14 1137  INR 1.00    dressing intact.  did not remove dressing today.    Assessment/Plan: 2 Days Post-Op Procedure(s) (LRB): Left Foot 1st MTP I&D with Bone Resection, and Antibiotic Cement (Left) Continue ABX therapy due to Post-op infection Further planning after Dr Ophelia CharterYates sees pt.   Will plan to dc the PCA pump today and wean to po meds in anticipation for discharge. Will need HHRN for pin care and abx treatment.  VERNON,SHEILA M 01/08/2014, 11:16 AM Patient and his Mother have had the procedure discussed extensively with review in holding area before the surgery. Procedure performed was exactly what was planned and reviewed and consented to .  Will review once again plan as I have done on multiple occasions.

## 2014-01-08 NOTE — Progress Notes (Signed)
ANTIBIOTIC CONSULT NOTE - FOLLOW UP  Pharmacy Consult for vancomycin Indication: osteo  Labs:  Recent Labs  01/06/14 1137  WBC 4.8  HGB 16.0  PLT 193  CREATININE 1.04   Estimated Creatinine Clearance: 94.5 ml/min (by C-G formula based on Cr of 1.04).  Recent Labs  01/08/14 0429  VANCOTROUGH 9.0*     Microbiology: Recent Results (from the past 720 hour(s))  WOUND CULTURE     Status: None   Collection Time    01/06/14  4:36 PM      Result Value Range Status   Specimen Description WOUND TOE LEFT   Final   Special Requests SAMPLE NO 1   Final   Gram Stain     Final   Value: NO WBC SEEN     NO SQUAMOUS EPITHELIAL CELLS SEEN     NO ORGANISMS SEEN     [facility]formed [facility]   Culture     Final   Value: NO GROWTH     [facility]formed at [facility]   Report Status PENDING   Incomplete  ANAEROBIC CULTURE     Status: None   Collection Time    01/06/14  4:36 PM      Result Value Range Status   Specimen Description WOUND TOE LEFT   Final   Special Requests SAMPLE NO 1   Final   Gram Stain     Final   Value: NO WBC SEEN     NO SQUAMOUS EPITHELIAL CELLS SEEN     NO ORGANISMS SEEN     [facility]formed at [facility]   Culture     Final   Value: NO ANAEROBES ISOLATED; CULTURE IN PROGRESS FOR 5 DAYS     [facility]formed at [facility]   Report Status PENDING   Incomplete  WOUND CULTURE     Status: None   Collection Time    01/06/14  4:38 PM      Result Value Range Status   Specimen Description WOUND TOE LEFT   Final   Special Requests SAMPLE NO 2   Final   Gram Stain     Final   Value: NO WBC SEEN     NO SQUAMOUS EPITHELIAL CELLS SEEN     NO ORGANISMS SEEN     [facility] at [facility]   Culture     Final   Value: NO GROWTH 1 DAY     [facility] at [facility]   Report Status PENDING   Incomplete     Assessment: 27yo male subtherapeutic on vancomycin with initial dosing for osteo; now post-op w/o purulent pockets visualized, per  surgeon appears not to be severe virulent type of bacteria.  Goal of Therapy:  Vancomycin trough level 15-20 mcg/ml  Plan:  Will change vancomycin to 1250mg  IV Q8H for calculated trough of ~15 and continue to monitor.  Vernard GamblesVeronda Carissa Musick, PharmD, BCPS  01/08/2014,5:37 AM

## 2014-01-08 NOTE — Evaluation (Signed)
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 With drainage he had increased pain and went to [facility].  xrays in office 01/05/14 showed loss of joint space and bone erosion on both sides of 1st MTP joint.  Concern that plated 1st MTP shaft had not healed and with partial 2mm backup of 2 screws he is now brought back for debridement of infected bone , removal of plate,  external fixation as needed and IV ABX, PICC line for home ABX.    Clinical Impression  This patient presents with acute pain and decreased functional independence following the above mentioned procedure. At the time of PT eval, pt was able to perform transfers with supervision and cues for safety. This patient is appropriate for skilled PT interventions to address functional limitations, improve safety and independence with functional mobility, and return to PLOF.     PT Assessment  Patient needs continued PT services    Follow Up Recommendations  No PT follow up    Does the patient have the potential to tolerate intense rehabilitation      Barriers to Discharge        Equipment Recommendations  None recommended by PT    Recommendations for Other Services     Frequency Min 5X/week    Precautions / Restrictions Precautions Precautions: Fall Restrictions Weight Bearing Restrictions: Yes LLE Weight Bearing: Non weight bearing   Pertinent Vitals/Pain 8/10 at rest. Pt reports he had pain medication prior to session beginning.       Mobility  Bed Mobility Overal bed mobility: Needs Assistance Bed Mobility: Supine to Sit Supine to sit:  Supervision General bed mobility comments: No physical assist required.  Transfers Overall transfer level: Needs assistance Equipment used: Rolling walker (2 wheeled) Transfers: Sit to/from [employer] Sit to Stand: Supervision Stand pivot transfers: Supervision General transfer comment: VC's for hand placement on seated surface for safety. Pt continued to pull up from walker, but did not require any physical assistance.  Ambulation/Gait General Gait Details: Pt declined any further OOB mobility at this time.     Exercises     PT Diagnosis: Difficulty walking;Acute pain  PT Problem List: Decreased strength;Decreased range of motion;Decreased balance;Decreased activity tolerance;Decreased mobility;Decreased knowledge of use of DME;Decreased knowledge of precautions;Decreased safety awareness;Pain PT Treatment Interventions: DME instruction;Gait training;Stair training;Functional mobility training;Therapeutic activities;Therapeutic exercise;Neuromuscular re-education;Patient/family education     PT Goals(Current goals can be found in the care plan section) Acute Rehab PT Goals Patient Stated Goal: To leave the hospital. 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 With drainage he had increased pain and went to [facility].  xrays in office 01/05/14 showed loss of joint space and bone erosion on both sides of 1st MTP joint.  Concern that plated 1st MTP shaft had not healed and with partial 2mm backup of 2 screws he is now brought back for debridement of infected  bone , removal of plate,  External fixation as needed and IV ABX, PICC  line for home ABX.         Prior Functioning  Home Living Family/patient expects to be discharged to:: Private residence Living Arrangements: Spouse/significant other Available Help at Discharge: Family;Available PRN/intermittently Type of Home: Apartment Home Access: Stairs to enter Entrance Stairs-Number of Steps: Flight - pt reports 15 steps Entrance Stairs-Rails: Right;Left;Can reach both Home Layout: One level Home Equipment: Environmental consultantWalker - 2 wheels Prior Function Level of Independence: Independent with assistive device(s) Communication Communication: No difficulties Dominant Hand: Right    Cognition  Cognition Arousal/Alertness: Awake/alert Behavior During Therapy: WFL for tasks assessed/performed Overall Cognitive Status: Within Functional Limits for tasks assessed    Extremity/Trunk Assessment Upper Extremity Assessment Upper Extremity Assessment: Overall WFL for tasks assessed Lower Extremity Assessment Lower Extremity Assessment: Overall WFL for tasks assessed;RLE deficits/detail RLE Deficits / Details: Decreased strength and ROM consistent with above mentioned procedures.  RLE: Unable to fully assess due to pain;Unable to fully assess due to immobilization Cervical / Trunk Assessment Cervical / Trunk Assessment: Normal   Balance Balance Overall balance assessment: No apparent balance deficits (not formally assessed)  End of Session PT - End of Session Equipment Utilized During Treatment: Gait belt Activity Tolerance: Patient tolerated treatment well Patient left: in chair;with call bell/phone within reach;with family/visitor present Nurse Communication: Mobility status  GP     Ruthann CancerHamilton, Nataya Bastedo 01/08/2014, 4:54 PM  Ruthann CancerLaura Hamilton, PT, DPT [phone]

## 2014-01-08 NOTE — Op Note (Signed)
NAMESANCHEZ, HEMMER NO.:  [id number]  MEDICAL RECORD NO.:  [id number]  LOCATION:  5N16C                        FACILITY:  [facility]  PHYSICIAN:  Kentrell Hallahan C. Ophelia Charter, M.D.    DATE OF BIRTH:  [birth year]  DATE OF PROCEDURE:  01/06/2014 DATE OF DISCHARGE:                              OPERATIVE REPORT   PREOPERATIVE DIAGNOSES:  Left foot crush injury with first metatarsophalangeal joint osteomyelitis and infected nonunion.  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  PROCEDURE:  Left foot sharp excisional debridement of first metatarsophalangeal joint with metatarsal head resection.  Removal of hardware.  Takedown of nonunion, resection of nonunion.  Antibiotic cement spacers and external fixator spanning first metatarsophalangeal joint and nonunion.  SURGEON:  Modest Draeger C. Ophelia Charter, M.D.  ASSISTANT:  Maud Deed, PA-C.  TOURNIQUET TIME:  1 hour 10 minutes approximately.  ANESTHESIA:  General LMA.  BRIEF HISTORY:  A 28 year old male on job pressure injury on 09/04/2013 underwent washout procedure.  He had blisters.  This was a crush between a forklift and a metal mold which was an immobile object.  He had a severe crush injury, splits between the toes, multiple metatarsal fractures including 1st comminuted, 3rd and 5th comminuted.  Open first metatarsophalangeal joint IP dislocation of the great toe as well and multiple lacerations.  He underwent washout procedure and came back to the operating room on 09/07/2013, 3 days later repeat washout stabilization plating of the fracture which was then the proximal third of the first metatarsal, reduction of the metatarsophalangeal joint and interphalangeal joint with pinning through the tip of the toe across both joints into the metatarsal head for stabilization.  The patient is a smoker and has continued to smoke unfortunately.  Postoperative  x-rays look good for 2 months, swelling went down, he had blister formation and soft tissue injury over the dorsum of the foot.  [employer] ordered but never approved.  Yet on dressing changes, eventually the wound on the dorsum of the foot has healed and then 16 weeks later he developed some pinpoint drainage starting last week over the medial aspect of the great toe with 1-3 mm spot on the sock after 24 hours.  X-ray demonstrated damage to the metatarsophalangeal joint, erosion of the head consistent with osteomyelitis and he is brought back in for surgical treatment. Initial x-rays looked like the fracture was healing that had been anatomically plated.  The most recent x-ray, there was lack of progress and with drainage and evidence of loosening of screws, it was obvious that he had an infected nonunion.  DESCRIPTION OF PROCEDURE:  After standard prepping and draping, with the anesthesia, antibiotics IV were held and old incision was opened in the medial side of the foot after time-out procedure was performed and extremity sheets and drapes were applied as well stockinette.  Leg was elevated prior to tourniquet inflation, no Esmarch was used.  There was 1 mm pinpoint sinus that was directly over the medial aspect of the first metatarsophalangeal joint.  This was excised with a 2 mm edge of skin.  Cutting directly down on the lateral aspect, plate was noted. Screws were removed.  There was some brownish tissue along the edges of the plate consistent with infection. The adjacent tissue was carefully inspected. There was no evidence of purulent pocket, no abscess.  The screws were loose removed, plate was removed.  There were erosive changes in the metatarsal head.  No remaining joint cartilage surface and using oscillating saw with baby Hohmann protecting, the medial aspect of bone was resected on each side of the joint for total bone removal of all 1 cm.  The medullary canal of the  proximal phalanx was curetted.  Pulsatile lavage was used.  The distal aspect metatarsal with screws were present showed evidence of infection. These screw holes were curetted as well.  In between 2 of the screws, there was a cortical erosion which was also curetted.  Nonunion had some motion present. This was resected also with oscillating saw, taking the soft tissue, resecting 6 mm of bone, 3 mm on each side.  The proximal bone was normal appearance, again pulsatile lavage was used copiously.  Before any lavage was used, aerobic and anaerobic cultures were obtained along the edge of the plate and then culture was obtained of the first metatarsophalangeal joint grabbing some synovium tissue and this was labeled great toe.  First culture was labeled foot left.  Once the hardware was removed, resection of infected bone had been performed, copious pulsatile lavage, Palacos cement was mixed with tobramycin and vancomycin, powder was mixed together.  A round wafer was formed indenting similar to a checker.  One was approximately 5 mm and extra pieces had to be made since initially they were little bit too large and would not fit in the space.  Periosteum was elevated, so that 1 piece of antibiotic spacer was inserted.  There was overhanging over some of the cortex to make a pseudocapsule of neovascularity for later placement of bone graft once infection is hopefully eradicated.  Once the 2 spacers were in place, they had to be trimmed some sort of prominent skin closed.  Synthes mini fixator was placed with 2 pins in the proximal phalanx, 2 pins in the proximal portion of the metatarsal, and 2 small graphite rods with length was used since the longest rod would not reach the span.  It was adjusted position and the toe was held in appropriate rotation and length and fixator was tightened down.  The spot fluoro pictures were taken with the mini C-arm documenting placement of the spacers,  resection of the bone, and the position of external fixator.  One of the pins close to the tarsometatarsal joint did not appear to violate it.  The skin at the stab sites looked normal. Repeat washout was performed and then a 2-0 nylon was used for skin closure.  Xeroform, 4 x 4's, ABD, and Ace wrap was applied for postoperative dressing,  Plan is Infectious Disease consultation, IV antibiotics per culture results, he may not grow anything on his culture.  By inspection of the tissue, this did not appear to be severe virulent type of bacteria.  No pockets of purulence were encountered. The patient tolerated the procedure well.  He was transferred the recovery room.  There was good capillary refill to the toe at the tip. The patient had fungal infections on his toenails and great toenail was double layer and portion of the old nail that was about 50% attached was removed with a rongeur.  Before surgery started at the beginning of the case, the foot was scrubbed by me with Betadine  scrub brushes and  then prepped with DuraPrep.  The patient was transferred to recovery room after removal of the LMA tube and tolerated the procedure well.     Taggart Prasad C. Ophelia Charter, M.D.     MCY/MEDQ  D:  01/07/2014  T:  01/08/2014  Job:  [id number]  cc:   Workers' Comp

## 2014-01-08 NOTE — Care Management Note (Signed)
01/08/14 2:15pm Mike PeperSusan Federico Maiorino, RN BSN Case Manager CM called and left message for [name], per [name], patient will possibly discharge on Monday 2/2/5, waiting for cultures to result.

## 2014-01-09 DIAGNOSIS — B965 Pseudomonas (aeruginosa) (mallei) (pseudomallei) as the cause of diseases classified elsewhere: Secondary | ICD-10-CM

## 2014-01-09 DIAGNOSIS — M869 Osteomyelitis, unspecified: Secondary | ICD-10-CM

## 2014-01-09 LAB — WOUND CULTURE: GRAM STAIN: NONE SEEN

## 2014-01-09 MED ORDER — WHITE PETROLATUM GEL
Status: AC
  Administered 2014-01-09: 10:00:00
  Filled 2014-01-09: qty 5

## 2014-01-09 NOTE — Progress Notes (Signed)
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 With drainage he had increased pain and went to [facility].  xrays in office 01/05/14 showed loss of joint space and bone erosion on both sides of 1st MTP joint.  Concern that plated 1st MTP shaft had not healed and with partial 2mm backup of 2 screws he is now brought back for debridement of infected bone , removal of plate,  External fixation as needed and IV ABX, PICC line for home ABX.     PT Comments   Patient agreeable to complete small amount of ambulation and did well. Did not attempt steps  Follow Up Recommendations  No PT follow up     Does the patient have the potential to tolerate intense rehabilitation     Barriers to Discharge        Equipment Recommendations  None recommended by PT    Recommendations for Other Services    Frequency Min 5X/week   Progress towards PT Goals Progress towards PT goals: Progressing toward goals  Plan Current plan remains appropriate    Precautions / Restrictions Precautions Precautions: Fall Restrictions LLE Weight Bearing: Non weight bearing   Pertinent Vitals/Pain no apparent distress    Mobility  Transfers Overall transfer level: Modified independent Ambulation/Gait Ambulation/Gait assistance: Supervision Ambulation Distance (Feet): 30 Feet Assistive device: Rolling walker (2 wheeled) General Gait Details: Patient did well managing RW with safe technique    Exercises     PT Diagnosis:    PT Problem List:   PT Treatment Interventions:     PT Goals (current goals can now be found in the care plan section)     Visit Information  Last PT Received On: 01/09/14 Assistance Needed: +1 History of Present Illness: Pt is a 28 y/o male admitted s/p left foot crush injury 09/04/13 with 1st metatarsal-phalangeal joint infection, osteomyelitis , 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 With drainage he had increased pain and went to [facility].  xrays in office 01/05/14 showed loss of joint space and bone erosion on both sides of 1st MTP joint.  Concern that plated 1st MTP shaft had not healed and with partial 2mm backup of 2 screws he is now brought back for debridement of infected bone , removal of plate,  External fixation as needed and IV ABX, PICC line for home ABX.      Subjective Data      Cognition  Cognition Arousal/Alertness: Awake/alert Behavior During Therapy: WFL for tasks assessed/performed Overall Cognitive Status: Within Functional Limits for tasks assessed    Balance     End of Session PT - End of Session Equipment Utilized During Treatment: Gait belt Activity Tolerance: Patient tolerated treatment well Patient left: in chair;with call bell/phone within reach;with family/visitor present Nurse Communication: Mobility status   GP     Fredrich BirksRobinette, Lafern Brinkley Elizabeth 01/09/2014, 10:36 AM  01/09/2014 Fredrich Birksobinette, Seddrick Flax Elizabeth PTA [phone] pager [phone] office

## 2014-01-09 NOTE — Progress Notes (Signed)
Patient complains of "feeling funny". VS checked: BP 104/66, Pulse 74, O2 100. Pt states its not a pain in chest, just a funny feeling in chest. Will continue to monitor.

## 2014-01-09 NOTE — Progress Notes (Signed)
[facility]  Date of Admission:  01/06/2014  Antibiotics: Antibiotics Given (last 72 hours)   Date/Time Action Medication Dose Rate   01/06/14 1655 Given  [Mixed in Bone cement with 1.2gm Tobramycin]   vancomycin (VANCOCIN) powder 1,000 mg    01/06/14 1655 Given  [used in bone cement with 1g Vancomycin]   tobramycin (NEBCIN) powder 1.2 g    01/06/14 2205 Given   piperacillin-tazobactam (ZOSYN) IVPB 3.375 g 3.375 g 12.5 mL/hr   01/06/14 2206 Given   vancomycin (VANCOCIN) IVPB 750 mg/150 ml premix 750 mg 150 mL/hr   01/07/14 0410 Given   piperacillin-tazobactam (ZOSYN) IVPB 3.375 g 3.375 g 12.5 mL/hr   01/07/14 0410 Given   vancomycin (VANCOCIN) IVPB 750 mg/150 ml premix 750 mg 150 mL/hr   01/07/14 1402 Given   vancomycin (VANCOCIN) IVPB 750 mg/150 ml premix 750 mg 150 mL/hr   01/07/14 1403 Given   levofloxacin (LEVAQUIN) tablet 750 mg 750 mg    01/07/14 2109 Given   vancomycin (VANCOCIN) IVPB 750 mg/150 ml premix 750 mg 150 mL/hr   01/08/14 0631 Given   vancomycin (VANCOCIN) 1,250 mg in sodium chloride 0.9 % 250 mL IVPB 1,250 mg 166.7 mL/hr   01/08/14 1206 Given   levofloxacin (LEVAQUIN) tablet 750 mg 750 mg    01/08/14 1548 Given   cefTRIAXone (ROCEPHIN) 2 g in dextrose 5 % 50 mL IVPB 2 g 100 mL/hr      Subjective: No complaints, still upset with having an infection  Objective: Temp:  [97.9 F (36.6 C)-98.9 F (37.2 C)] 97.9 F (36.6 C) (01/31 0552) Pulse Rate:  [60-89] 60 (01/31 0552) Resp:  [18-20] 20 (01/31 0552) BP: (95-111)/(59-75) 95/66 mmHg (01/31 0552) SpO2:  [94 %-99 %] 98 % (01/31 0552)  General: Awake, alert, nad Skin: no rashes Ext: pin in place  Lab Results Lab Results  Component Value Date   WBC 4.8 01/06/2014   HGB 16.0 01/06/2014   HCT 46.3 01/06/2014   MCV 81.8 01/06/2014   PLT 193 01/06/2014    Lab Results  Component Value Date   CREATININE 1.04 01/06/2014   BUN 12 01/06/2014   NA 140 01/06/2014   K 3.9  01/06/2014   CL 102 01/06/2014   CO2 26 01/06/2014    Lab Results  Component Value Date   ALT 15 01/06/2014   AST 15 01/06/2014   ALKPHOS 68 01/06/2014   BILITOT 0.8 01/06/2014      Microbiology: Recent Results (from the past 240 hour(s))  WOUND CULTURE     Status: None   Collection Time    01/06/14  4:36 PM      Result Value Range Status   Specimen Description WOUND TOE LEFT   Final   Special Requests SAMPLE NO 1   Final   Gram Stain     Final   Value: NO WBC SEEN     NO SQUAMOUS EPITHELIAL CELLS SEEN     NO ORGANISMS SEEN     [facility]formed [facility]   Culture     Final   Value: RARE GRAM NEGATIVE RODS     Performed at [facility]   Report Status PENDING   Incomplete  ANAEROBIC CULTURE     Status: None   Collection Time    01/06/14  4:36 PM      Result Value Range Status   Specimen Description WOUND TOE LEFT   Final   Special Requests SAMPLE  NO 1   Final   Gram Stain     Final   Value: NO WBC SEEN     NO SQUAMOUS EPITHELIAL CELLS SEEN     NO ORGANISMS SEEN     Performed at [facility]   Culture     Final   Value: NO ANAEROBES ISOLATED; CULTURE IN PROGRESS FOR 5 DAYS     Performed at [facility]   Report Status PENDING   Incomplete  WOUND CULTURE     Status: None   Collection Time    01/06/14  4:38 PM      Result Value Range Status   Specimen Description WOUND TOE LEFT   Final   Special Requests SAMPLE NO 2   Final   Gram Stain     Final   Value: NO WBC SEEN     NO SQUAMOUS EPITHELIAL CELLS SEEN     NO ORGANISMS SEEN     Performed at [facility]   Culture     Final   Value: RARE PSEUDOMONAS AERUGINOSA     Performed at [facility]   Report Status 01/09/2014 FINAL   Final   Organism ID, Bacteria PSEUDOMONAS AERUGINOSA   Final    Studies/Results: No results found.  Assessment/Plan: 1)  Osteomyelitis - it is Pseudomonas and is sensitive to fluoroquinolones.  In this case, oral levaquin is equivalent to IV  therapy and no indication for IV medication at home.  With the families nervousness of IV antibiotics, I did explain that it is the best option and will be easier.  I did explain that the picc will not be needed and though it was already placed in anticipation, it can be removed at discharge.  The mother did voice some frustration with picc placement but I explained that it can help facilitate discharge and is a much less common to not need it.  She voiced her understanding.   Will change to levaquin 750 mg po daily for anticipated 6 weeks -can remove picc at discharge -we will arrange follow up in [facility] in about 2 weeks  COMER, Molly Maduro, MD [email or web address] [phone] pager   [phone] cell 01/09/2014, 10:53 AM

## 2014-01-09 NOTE — Progress Notes (Signed)
   Subjective:  Patient reports pain as moderate.    Objective:   VITALS:   Filed Vitals:   01/08/14 0631 01/08/14 1444 01/08/14 2200 01/09/14 0552  BP: 125/80 111/75 109/59 95/66  Pulse: 82 89 71 60  Temp: 98.6 F (37 C) 98.9 F (37.2 C) 98.5 F (36.9 C) 97.9 F (36.6 C)  TempSrc:  Oral Oral Oral  Resp: 20 20 18 20   Height:      Weight:      SpO2: 98% 94% 99% 98%    Neurologically intact Incision: dressing C/D/I and no drainage No cellulitis present Compartment soft   Lab Results  Component Value Date   WBC 4.8 01/06/2014   HGB 16.0 01/06/2014   HCT 46.3 01/06/2014   MCV 81.8 01/06/2014   PLT 193 01/06/2014     Assessment/Plan: 3 Days Post-Op   Problem List Items Addressed This Visit     Musculoskeletal and Integument   Acute osteomyelitis of metatarsal bone of left foot - Primary      Follow cultures IV abx ID following - appreciate their assistance   Cheral AlmasXu, Malka Bocek Michael 01/09/2014, 8:06 AM [phone]

## 2014-01-10 DIAGNOSIS — B965 Pseudomonas (aeruginosa) (mallei) (pseudomallei) as the cause of diseases classified elsewhere: Secondary | ICD-10-CM | POA: Diagnosis not present

## 2014-01-10 DIAGNOSIS — M869 Osteomyelitis, unspecified: Secondary | ICD-10-CM | POA: Diagnosis not present

## 2014-01-10 LAB — WOUND CULTURE: Gram Stain: NONE SEEN

## 2014-01-10 NOTE — Progress Notes (Signed)
Nurse tech took out trash. Half bag of IV fluids that was replaced with new bag was in the trash. Patient waited until I exited the room to ask the nurse tech if he could have the bag of fluids that I had thrown away. I went back into the room to explain to the patient that the bag contains only fluid and no medication and that the old bag was replaced so that the oncoming nurse would be relieved of a potential empty bag if running at 75 ml/hr. I attempted to explain to the patients mother as well she did not need further explanation.

## 2014-01-10 NOTE — Progress Notes (Signed)
Patient states that "previous" nurses have had IV fluids running at 20 ml/hr since his admission. The order is written for 75 ml/hr. When I started the fluids at 75 ml/hr patient was confused as to why fluids were increased. An explanation was given as related to the orders. Patient requested the fluids remain at 75 ml/hr.

## 2014-01-10 NOTE — Progress Notes (Signed)
Patient distressed about not receiving his pain medication according to his "schedule". Discussion was had regarding pain medicine being given on an "as needed" basis every designated number of hours. Clarified that pain medication can not be given more frequently than ordered, and that medicine is scanned when given to patient.

## 2014-01-10 NOTE — Progress Notes (Signed)
Subjective:  Patient reports RN trying to poison patient with IVFs.  Wants IVFs turned back down to 20 cc/hr.  Objective:   VITALS:   Filed Vitals:   01/09/14 0552 01/09/14 1412 01/09/14 1526 01/09/14 2123  BP: 95/66 130/48 106/62 115/80  Pulse: 60   79  Temp: 97.9 F (36.6 C) 98.3 F (36.8 C) 97.8 F (36.6 C) 98.4 F (36.9 C)  TempSrc: Oral Oral Oral Oral  Resp: 20 20 18 18   Height:      Weight:      SpO2: 98% 100% 96% 96%    Incision c/d/i Pin sites c/d/i   Lab Results  Component Value Date   WBC 4.8 01/06/2014   HGB 16.0 01/06/2014   HCT 46.3 01/06/2014   MCV 81.8 01/06/2014   PLT 193 01/06/2014     Assessment/Plan: 4 Days Post-Op   Problem List Items Addressed This Visit     Musculoskeletal and Integument   Acute osteomyelitis of metatarsal bone of left foot - Primary      Pseudomonas IV abx ID following - appreciate their assistance Dressings changed - reassured patient and mother incision can be left to air for a brief period of time   Cheral AlmasXu, Naiping Michael 01/10/2014, 7:31 AM [phone]

## 2014-01-10 NOTE — Progress Notes (Signed)
[facility]  Date of Admission:  01/06/2014  Antibiotics: Antibiotics Given (last 72 hours)   Date/Time Action Medication Dose Rate   01/07/14 1402 Given   vancomycin (VANCOCIN) IVPB 750 mg/150 ml premix 750 mg 150 mL/hr   01/07/14 1403 Given   levofloxacin (LEVAQUIN) tablet 750 mg 750 mg    01/07/14 2109 Given   vancomycin (VANCOCIN) IVPB 750 mg/150 ml premix 750 mg 150 mL/hr   01/08/14 0631 Given   vancomycin (VANCOCIN) 1,250 mg in sodium chloride 0.9 % 250 mL IVPB 1,250 mg 166.7 mL/hr   01/08/14 1206 Given   levofloxacin (LEVAQUIN) tablet 750 mg 750 mg    01/08/14 1548 Given   cefTRIAXone (ROCEPHIN) 2 g in dextrose 5 % 50 mL IVPB 2 g 100 mL/hr   01/09/14 1500 Given   cefTRIAXone (ROCEPHIN) 2 g in dextrose 5 % 50 mL IVPB 2 g 100 mL/hr      Subjective: No acute events  Objective: Temp:  [97.8 F (36.6 C)-98.4 F (36.9 C)] 98.4 F (36.9 C) (01/31 2123) Pulse Rate:  [79] 79 (01/31 2123) Resp:  [18-20] 18 (01/31 2123) BP: (106-130)/(48-80) 115/80 mmHg (01/31 2123) SpO2:  [96 %-100 %] 96 % (01/31 2123)    Lab Results Lab Results  Component Value Date   WBC 4.8 01/06/2014   HGB 16.0 01/06/2014   HCT 46.3 01/06/2014   MCV 81.8 01/06/2014   PLT 193 01/06/2014    Lab Results  Component Value Date   CREATININE 1.04 01/06/2014   BUN 12 01/06/2014   NA 140 01/06/2014   K 3.9 01/06/2014   CL 102 01/06/2014   CO2 26 01/06/2014    Lab Results  Component Value Date   ALT 15 01/06/2014   AST 15 01/06/2014   ALKPHOS 68 01/06/2014   BILITOT 0.8 01/06/2014      Microbiology: Recent Results (from the past 240 hour(s))  WOUND CULTURE     Status: None   Collection Time    01/06/14  4:36 PM      Result Value Range Status   Specimen Description WOUND TOE LEFT   Final   Special Requests SAMPLE NO 1   Final   Gram Stain     Final   Value: NO WBC SEEN     NO SQUAMOUS EPITHELIAL CELLS SEEN     NO ORGANISMS SEEN     Performed at [facility]   Culture     Final   Value: RARE PSEUDOMONAS AERUGINOSA     Note: SUSCEPTIBILITIES PERFORMED ON PREVIOUS CULTURE WITHIN THE LAST 5 DAYS.     Performed at [facility]   Report Status 01/10/2014 FINAL   Final  ANAEROBIC CULTURE     Status: None   Collection Time    01/06/14  4:36 PM      Result Value Range Status   Specimen Description WOUND TOE LEFT   Final   Special Requests SAMPLE NO 1   Final   Gram Stain     Final   Value: NO WBC SEEN     NO SQUAMOUS EPITHELIAL CELLS SEEN     NO ORGANISMS SEEN     Performed at [facility]   Culture     Final   Value: NO ANAEROBES ISOLATED; CULTURE IN PROGRESS FOR 5 DAYS     Performed at [facility]   Report Status PENDING   Incomplete  WOUND CULTURE  Status: None   Collection Time    01/06/14  4:38 PM      Result Value Range Status   Specimen Description WOUND TOE LEFT   Final   Special Requests SAMPLE NO 2   Final   Gram Stain     Final   Value: NO WBC SEEN     NO SQUAMOUS EPITHELIAL CELLS SEEN     NO ORGANISMS SEEN     [facility]   Culture     Final   Value: RARE PSEUDOMONAS AERUGINOSA     [facility] at [facility]   Report Status 01/09/2014 FINAL   Final   Organism ID, Bacteria PSEUDOMONAS AERUGINOSA   Final    Studies/Results: No results found.  Assessment/Plan: 1)  Osteomyelitis - it is Pseudomonas and is sensitive to fluoroquinolones.  In this case, oral levaquin is equivalent to IV therapy and no indication for IV medication at home.  With the families nervousness of IV antibiotics, I did explain that it is the best option and will be easier.  I did explain that the picc will not be needed and though it was already placed in anticipation, it can be removed at discharge.  The mother did voice some frustration with picc placement but I explained that it can help facilitate discharge and is a much less common to not need it.  She voiced her understanding.   Will change to  levaquin 750 mg po daily for anticipated 6 weeks through March 10th -we will check cbc and cmp at his visit in 2 weeks -can remove picc at discharge -we will arrange follow up in [facility] in about 2 weeks  Thanks for consult and call with questions  Staci RighterOMER, Danton Palmateer, MD [facility] [email or web address] [phone] pager   [phone] cell 01/10/2014, 1:00 PM

## 2014-01-11 ENCOUNTER — Encounter (HOSPITAL_COMMUNITY): Payer: Self-pay | Admitting: Orthopaedic Surgery

## 2014-01-11 LAB — ANAEROBIC CULTURE: Gram Stain: NONE SEEN

## 2014-01-11 MED ORDER — LEVOFLOXACIN 750 MG PO TABS
750.0000 mg | ORAL_TABLET | Freq: Every day | ORAL | Status: DC
  Administered 2014-01-11: 750 mg via ORAL
  Filled 2014-01-11 (×2): qty 1

## 2014-01-11 MED ORDER — FLUOXETINE HCL 20 MG PO CAPS: 20.0000 mg | ORAL_CAPSULE | Freq: Every day | ORAL | Status: DC

## 2014-01-11 MED ORDER — LEVOFLOXACIN 750 MG PO TABS: 750.0000 mg | ORAL_TABLET | Freq: Every day | ORAL | Status: DC

## 2014-01-11 MED ORDER — GABAPENTIN 100 MG PO CAPS
100.0000 mg | ORAL_CAPSULE | Freq: Three times a day (TID) | ORAL | Status: DC
Start: 2014-01-11 — End: 2014-07-01

## 2014-01-11 MED ORDER — ASPIRIN 325 MG PO TBEC: 325.0000 mg | DELAYED_RELEASE_TABLET | Freq: Every day | ORAL | Status: DC

## 2014-01-11 MED ORDER — OXYCODONE HCL 10 MG PO TABS: 10.0000 mg | ORAL_TABLET | ORAL | Status: DC | PRN

## 2014-01-11 NOTE — Progress Notes (Signed)
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 With drainage he had increased pain and went to [facility].  xrays in office 01/05/14 showed loss of joint space and bone erosion on both sides of 1st MTP joint.  Concern that plated 1st MTP shaft had not healed and with partial 2mm backup of 2 screws he is now brought back for debridement of infected bone , removal of plate,  External fixation as needed and IV ABX, PICC line for home ABX.     PT Comments   Pt does well with mobility with RW. States he feels comfortable ascending flight of steps to apartment, however still wants to practice prior to d/c. Frequent rest breaks required due to UE fatigue, however overall pt progressing nicely with mobility.   Follow Up Recommendations  No PT follow up     Does the patient have the potential to tolerate intense rehabilitation     Barriers to Discharge        Equipment Recommendations  None recommended by PT    Recommendations for Other Services    Frequency Min 5X/week   Progress towards PT Goals Progress towards PT goals: Progressing toward goals  Plan Current plan remains appropriate    Precautions / Restrictions Precautions Precautions: Fall Restrictions Weight Bearing Restrictions: Yes LLE Weight Bearing: Non weight bearing   Pertinent Vitals/Pain Pt reports 8/10 pain at beginning of session, and later states "it's not that bad". Pt and mother state he received pain medication prior to session.     Mobility  Bed Mobility Overal bed mobility: Modified Independent Bed Mobility: Supine to Sit General bed mobility  comments: No physical assist required.  Transfers Overall transfer level: Modified independent Equipment used: Rolling walker (2 wheeled) Transfers: Sit to/from Stand General transfer comment: Pt demonstrated proper hand placement and safety awareness.  Ambulation/Gait Ambulation/Gait assistance: Supervision Ambulation Distance (Feet): 25 Feet Assistive device: Rolling walker (2 wheeled) Gait Pattern/deviations: Step-to pattern Gait velocity: Decreased Gait velocity interpretation: Below normal speed for age/gender General Gait Details: Patient did well managing RW with safe technique. Distance decreased to conserve energy for stair-training. Stairs: Yes Stairs assistance: Min assist Stair Management: Backwards;With walker Number of Stairs: 3 General stair comments: Pt reports having 2 railings close enough to grab both at same time at home. Practiced in stair well where 2 railings not an option. Will plan for 2 railings next session.     Exercises     PT Diagnosis:    PT Problem List:   PT Treatment Interventions:     PT Goals (current goals can now be found in the care plan section) Acute Rehab PT Goals Patient Stated Goal: To leave the hospital. 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 With drainage he had increased pain and went to [facility].  xrays in office 01/05/14 showed loss of joint space and bone erosion on both sides  of 1st MTP joint.  Concern that plated 1st MTP shaft had not healed and with partial 2mm backup of 2 screws he is now brought back for debridement of infected bone , removal of plate,  External fixation as needed  and IV ABX, PICC line for home ABX.      Subjective Data  Subjective: "What do you want me to do? I guess I can get up." Patient Stated Goal: To leave the hospital.   Cognition  Cognition Arousal/Alertness: Awake/alert Behavior During Therapy: WFL for tasks assessed/performed Overall Cognitive Status: Within Functional Limits for tasks assessed    Balance  Balance Overall balance assessment: No apparent balance deficits (not formally assessed)  End of Session PT - End of Session Equipment Utilized During Treatment: Gait belt Activity Tolerance: Patient tolerated treatment well Patient left: in chair;with call bell/phone within reach;with family/visitor present Nurse Communication: Mobility status   GP     [name], [name] 01/11/2014, 12:38 PM  Mike CancerLaura [name], PT, DPT [phone]

## 2014-01-11 NOTE — Discharge Instructions (Signed)
Patient is to follow up in 2 weeks with Dr. Annell GreeningMark Yates, call office for appointment.  Patient will receive phone call from Workers comp Adjuster within next day or so , concerning what Home Health agency will provide Home Health Nurse. Questions call Marcy SirenCarolyn Fix [phone]

## 2014-01-11 NOTE — Progress Notes (Signed)
Subjective: 5 Days Post-Op Procedure(s) (LRB): Left Foot 1st MTP I&D with Bone Resection, and Antibiotic Cement (Left) Patient reports pain as moderate. He asks for pain meds around the clock . Wants to be seen frequently after discharge to prevent having any complications. Discussed this with him in detail. Mother agrees to do pin tract care.   Objective: Vital signs in last 24 hours: Temp:  [97.9 F (36.6 C)-98.6 F (37 C)] 97.9 F (36.6 C) (02/02 0500) Pulse Rate:  [75-80] 80 (02/02 0500) Resp:  [18-19] 18 (02/02 0500) BP: (101-114)/(56-68) 101/62 mmHg (02/02 0500) SpO2:  [96 %-100 %] 96 % (02/02 0500)  Intake/Output from previous day: 02/01 0701 - 02/02 0700 In: 1190 [P.O.:240; I.V.:900; IV Piggyback:50] Out: 1675 [Urine:1675] Intake/Output this shift: Total I/O In: 240 [P.O.:240] Out: -   No results found for this basename: HGB,  in the last 72 hours No results found for this basename: WBC, RBC, HCT, PLT,  in the last 72 hours No results found for this basename: NA, K, CL, CO2, BUN, CREATININE, GLUCOSE, CALCIUM,  in the last 72 hours No results found for this basename: LABPT, INR,  in the last 72 hours  trace drainage around pin tracts over 24 hrs. pin tract care to be instructed to his mother.  Pseudomonas pos cultures. Levaquin ordered. He as Vanc/tobra PMMA spacers in place.  Assessment/Plan: 5 Days Post-Op Procedure(s) (LRB): Left Foot 1st MTP I&D with Bone Resection, and Antibiotic Cement (Left) [facility] with [facility], wound assessment, pin tract care assessment. Etc. He will be on Levaquin 750mg  po daily for 2 months.   rov 2 wks Dr. Erlene QuanYates  YATES,MARK C 01/11/2014, 12:13 PM

## 2014-01-11 NOTE — Care Management Note (Signed)
01/11/14 12:26pm Vance PeperSusan Trenese Haft, RN BSN Case Manager Patient will not require IV antibiotics at discharge. Case Manager faxed orders for Home Health RN to check on foot. Faxed to  Eber JonesCarolyn ZOX-@ [phone]-@ [phone], Left message at [phone]. Workers Comp provider will notify patient with start of care.

## 2014-01-11 NOTE — Care Management Note (Signed)
01/11/14 4:18p [name], RN BSN Case Manager Case Manager received call from [place] with [facility], [facility], order faxed to her at [phone]. Phone [phone].  Case Manager informed the patient and wrote this info on discharge papers.

## 2014-01-21 ENCOUNTER — Ambulatory Visit (INDEPENDENT_AMBULATORY_CARE_PROVIDER_SITE_OTHER): Payer: Worker's Compensation | Admitting: Internal Medicine

## 2014-01-21 ENCOUNTER — Encounter: Payer: Self-pay | Admitting: Internal Medicine

## 2014-01-21 VITALS — BP 98/67 | HR 73 | Temp 97.8°F

## 2014-01-21 DIAGNOSIS — M86172 Other acute osteomyelitis, left ankle and foot: Secondary | ICD-10-CM

## 2014-01-21 DIAGNOSIS — M86179 Other acute osteomyelitis, unspecified ankle and foot: Secondary | ICD-10-CM

## 2014-01-21 LAB — COMPLETE METABOLIC PANEL WITH GFR
ALBUMIN: 4.2 g/dL (ref 3.5–5.2)
ALT: 14 U/L (ref 0–53)
AST: 12 U/L (ref 0–37)
Alkaline Phosphatase: 61 U/L (ref 39–117)
BUN: 11 mg/dL (ref 6–23)
CO2: 28 meq/L (ref 19–32)
Calcium: 9.8 mg/dL (ref 8.4–10.5)
Chloride: 102 mEq/L (ref 96–112)
Creat: 1.08 mg/dL (ref 0.50–1.35)
GFR, Est African American: >89 mL/min
GLUCOSE: 95 mg/dL (ref 70–99)
POTASSIUM: 4.2 meq/L (ref 3.5–5.3)
Sodium: 139 mEq/L (ref 135–145)
TOTAL PROTEIN: 7.1 g/dL (ref 6.0–8.3)
Total Bilirubin: 0.9 mg/dL (ref 0.2–1.2)

## 2014-01-21 LAB — CBC WITH DIFFERENTIAL/PLATELET
Basophils Absolute: 0 10*3/uL (ref 0.0–0.1)
Basophils Relative: 0 % (ref 0–1)
Eosinophils Absolute: 0.2 10*3/uL (ref 0.0–0.7)
Eosinophils Relative: 2 % (ref 0–5)
HEMATOCRIT: 46.3 % (ref 39.0–52.0)
Hemoglobin: 15.7 g/dL (ref 13.0–17.0)
LYMPHS ABS: 2.2 10*3/uL (ref 0.7–4.0)
LYMPHS PCT: 31 % (ref 12–46)
MCH: 27.1 pg (ref 26.0–34.0)
MCHC: 33.9 g/dL (ref 30.0–36.0)
MCV: 79.8 fL (ref 78.0–100.0)
MONOS PCT: 10 % (ref 3–12)
Monocytes Absolute: 0.7 10*3/uL (ref 0.1–1.0)
Neutro Abs: 4 10*3/uL (ref 1.7–7.7)
Neutrophils Relative %: 57 % (ref 43–77)
PLATELETS: 333 10*3/uL (ref 150–400)
RBC: 5.8 MIL/uL (ref 4.22–5.81)
RDW: 15.5 % (ref 11.5–15.5)
WBC: 7.1 10*3/uL (ref 4.0–10.5)

## 2014-01-21 LAB — C-REACTIVE PROTEIN

## 2014-01-21 NOTE — Assessment & Plan Note (Addendum)
He is doing well and will continue with levaquin.  Discussed nutrition.  rtc in 2 weeks.  Labs today to assure no side effects. All questions answered.

## 2014-01-21 NOTE — Progress Notes (Signed)
   Subjective:    Patient ID: Scot Docksiah L Ebright, male    DOB: 02/02/1986, 28 y.o.   MRN: [id number]  HPI Naomi L Basilia JumboCovington is a 28 y.o. male with left foot crush injury 09/04/13, open, between forklift and metal mold. He had 1st MTP dislocation, IP dislocation, 3rd MT fracture, comminuted 5th MT fracture and 5th toe phalanx fracture. He required pin fixation, plate fixation and was doing well but developed erosive changes 2 months later with drainage. Xray noted loosening and erosion and started on doxycycline. He came in for debridement by Dr. Ophelia CharterYates. Culture grew pansensitive Pseudomonas. He was started on levaquin and plan for 6-8 weeks. Still has pin in place, beads placed with anticipation to remove.  No fever, no chills.  No drainage, less edema.     Review of Systems  Constitutional: Negative for fever and chills.  Cardiovascular: Negative for leg swelling.  Gastrointestinal: Negative for nausea and diarrhea.  Skin: Negative for rash. Wound: no drainage.       Objective:   Physical Exam  Constitutional: He appears well-developed and well-nourished. No distress.  Musculoskeletal: He exhibits no edema.  Right foot with pin, less edema, no erythema, no tenderness   Skin: No rash noted.  Psychiatric: He has a normal mood and affect.          Assessment & Plan:

## 2014-01-25 NOTE — Discharge Summary (Signed)
Physician Discharge Summary  Patient ID: [name] MRN: [id number] DOB/AGE: 04/19/1986 28 y.o.  Admit date: 01/06/2014 Discharge date: 01/12/2014  Admission Diagnoses:  Acute osteomyelitis of metatarsal bone of left foot Left foot crush injury with first metatarsophalangeal joint osteomyelitis and infected nonunion.  Discharge Diagnoses:  Principal Problem:   Acute osteomyelitis of metatarsal bone of left foot same as above  Past Medical History  Diagnosis Date  . Sickle cell trait     Surgeries: Procedure(s):  Left foot sharp excisional debridement of first  metatarsophalangeal joint with metatarsal head resection. Removal of hardware. Takedown of nonunion, resection of nonunion. Antibiotic cement spacers and external fixator spanning first metatarsophalangeal  joint and nonunion.   Consultants (if any):  Molly MaduroRobert Comer,MD of Infectious Disease Department.  Discharged Condition: Improved  Hospital Course: Mike Frances MaywoodL Lenahan is an 28 y.o. male who was admitted 01/06/2014 with a diagnosis of Acute osteomyelitis of metatarsal bone of left foot and went to the operating room on 01/06/2014 and underwent the above named procedures.    He was given perioperative antibiotics:      Anti-infectives   Start     Dose/Rate Route Frequency Ordered Stop   01/11/14 1300  levofloxacin (LEVAQUIN) tablet 750 mg  Status:  Discontinued    Comments:  Dose now please. He is being discharged.   750 mg Oral Daily 01/11/14 1213 01/11/14 2212   01/11/14 0000  levofloxacin (LEVAQUIN) 750 MG tablet    Comments:  Osteomyelitis of left foot   750 mg Oral Daily 01/11/14 1226     01/08/14 1400  cefTRIAXone (ROCEPHIN) 2 g in dextrose 5 % 50 mL IVPB  Status:  Discontinued     2 g 100 mL/hr over 30 Minutes Intravenous Every 24 hours 01/08/14 1326 01/11/14 2212   01/08/14 0600  vancomycin (VANCOCIN) 1,250 mg in sodium chloride 0.9 % 250 mL IVPB  Status:  Discontinued     1,250 mg 166.7 mL/hr over 90  Minutes Intravenous Every 8 hours 01/08/14 0542 01/08/14 1326   01/07/14 1100  levofloxacin (LEVAQUIN) tablet 750 mg  Status:  Discontinued     750 mg Oral Daily 01/07/14 1047 01/08/14 1326   01/06/14 2100  piperacillin-tazobactam (ZOSYN) IVPB 3.375 g  Status:  Discontinued     3.375 g 12.5 mL/hr over 240 Minutes Intravenous Every 8 hours 01/06/14 2010 01/07/14 0849   01/06/14 2100  vancomycin (VANCOCIN) IVPB 750 mg/150 ml premix  Status:  Discontinued     750 mg 150 mL/hr over 60 Minutes Intravenous Every 8 hours 01/06/14 2010 01/08/14 0542   01/06/14 1655  vancomycin (VANCOCIN) powder  Status:  Discontinued       As needed 01/06/14 1655 01/06/14 1809   01/06/14 1655  tobramycin (NEBCIN) powder  Status:  Discontinued       As needed 01/06/14 1656 01/06/14 1809    With the assistance of the ID physicians, decision was made to use po Levaquin at discharge as the final cultures grew Pseudomonas.   Pt's mother was taught the technique for pin tract care and will do this at home.  Also [facility] Pt minimally ambulatory and advised in strict non weight bearing to the operative extremity and elevation at rest. Pt had significant difficulty with pain control but was eventually stable with po Oxycodone and was discharged on this. He was given sequential compression devices, early ambulation, and aspirin for DVT prophylaxis.  He benefited maximally from the hospital stay and there were  no complications.    Recent vital signs:  Filed Vitals:   01/11/14 0500  BP: 101/62  Pulse: 80  Temp: 97.9 F (36.6 C)  Resp: 18    Recent laboratory studies:  Lab Results  Component Value Date   HGB 15.7 01/21/2014   HGB 16.0 01/06/2014   HGB 12.5* 09/09/2013   Lab Results  Component Value Date   WBC 7.1 01/21/2014   PLT 333 01/21/2014   Lab Results  Component Value Date   INR 1.00 01/06/2014   Lab Results  Component Value Date   NA 139 01/21/2014   K 4.2 01/21/2014   CL 102  01/21/2014   CO2 28 01/21/2014   BUN 11 01/21/2014   CREATININE 1.08 01/21/2014   GLUCOSE 95 01/21/2014    Discharge Medications:     Medication List    STOP taking these medications       doxycycline 100 MG tablet  Commonly known as:  VIBRA-TABS     HYDROcodone-acetaminophen 5-325 MG per tablet  Commonly known as:  NORCO/VICODIN      TAKE these medications       aspirin 325 MG EC tablet  Take 1 tablet (325 mg total) by mouth daily.     FLUoxetine 20 MG capsule  Commonly known as:  PROZAC  Take 1 capsule (20 mg total) by mouth daily.     gabapentin 100 MG capsule  Commonly known as:  NEURONTIN  Take 1 capsule (100 mg total) by mouth 3 (three) times daily.     levofloxacin 750 MG tablet  Commonly known as:  LEVAQUIN  Take 1 tablet (750 mg total) by mouth daily.     Oxycodone HCl 10 MG Tabs  Take 1 tablet (10 mg total) by mouth every 3 (three) hours as needed for severe pain.        Diagnostic Studies: Dg Chest 2 View  01/06/2014   CLINICAL DATA:  History of osteomyelitis.  EXAM: CHEST  2 VIEW  COMPARISON:  None.  FINDINGS: Two views of the chest demonstrate clear lungs. Heart and mediastinum are within normal limits. Trachea is midline. Bony thorax is intact.  IMPRESSION: No active cardiopulmonary disease.   Electronically Signed   By: Richarda Overlie M.D.   On: 01/06/2014 13:26    Disposition: 06-Home-Health Care Svc   Future Appointments Provider Department Dept Phone   02/04/2014 1:45 PM Gardiner Barefoot, MD [facility] [phone]      Follow-up Information   Follow up with Eldred Manges, MD. Schedule an appointment as soon as possible for a visit in 1 week.   Specialty:  Orthopedic Surgery   Contact information:   [address] [place] [address] [phone]        Signed: Wende Neighbors 01/25/2014, 10:31 AM

## 2014-02-04 ENCOUNTER — Ambulatory Visit: Payer: Self-pay | Admitting: Internal Medicine

## 2014-02-11 ENCOUNTER — Ambulatory Visit (INDEPENDENT_AMBULATORY_CARE_PROVIDER_SITE_OTHER): Payer: Worker's Compensation | Admitting: Internal Medicine

## 2014-02-11 ENCOUNTER — Encounter: Payer: Self-pay | Admitting: Internal Medicine

## 2014-02-11 ENCOUNTER — Ambulatory Visit: Payer: Self-pay | Admitting: Internal Medicine

## 2014-02-11 VITALS — BP 101/69 | HR 71 | Temp 98.1°F

## 2014-02-11 DIAGNOSIS — M86172 Other acute osteomyelitis, left ankle and foot: Secondary | ICD-10-CM

## 2014-02-11 DIAGNOSIS — M86179 Other acute osteomyelitis, unspecified ankle and foot: Secondary | ICD-10-CM | POA: Diagnosis not present

## 2014-02-11 NOTE — Progress Notes (Signed)
   Subjective:    Patient ID: [name], male    DOB: 09-19-1986, [age] y.o.   MRN: [id number]  HPI  [name] is a 28 y.o. male with left foot crush injury 09/04/13, open, between forklift and metal mold. He had 1st MTP dislocation, IP dislocation, 3rd MT fracture, comminuted 5th MT fracture and 5th toe phalanx fracture. He required pin fixation, plate fixation and was doing well but developed erosive changes 2 months later with drainage. Xray noted loosening and erosion and started on doxycycline. He came in for debridement by Dr. Ophelia CharterYates. Culture grew pansensitive Pseudomonas. He was started on levaquin and plan for 6-8 weeks. Still has pin in place, beads placed with anticipation to remove.  No fever, no chills.  No drainage, less edema.     Review of Systems  Constitutional: Negative for fever and chills.  Cardiovascular: Negative for leg swelling.  Gastrointestinal: Negative for nausea and diarrhea.  Skin: Negative for rash. Wound: no drainage.       Objective:   Physical Exam  Constitutional: He appears well-developed and well-nourished. No distress.  Musculoskeletal: He exhibits no edema.  Right foot with pin, no edema, no erythema   Skin: No rash noted.  Psychiatric: He has a normal mood and affect.          Assessment & Plan:

## 2014-02-11 NOTE — Assessment & Plan Note (Signed)
Doing well on treeatment.  Will continue with levaquin and will shoot for 8 weeks though if he continues to have the pin in, this may be extended.  RTC 2 week.s   Labs today.

## 2014-02-12 ENCOUNTER — Telehealth: Payer: Self-pay | Admitting: *Deleted

## 2014-02-12 DIAGNOSIS — M869 Osteomyelitis, unspecified: Secondary | ICD-10-CM

## 2014-02-12 NOTE — Telephone Encounter (Signed)
Left message asking patient to call and make a lab appointment - he did not get the labs ordered at his office visit 3/5.  Future orders placed. Andree CossHowell, Kamel Haven M, RN

## 2014-02-17 ENCOUNTER — Other Ambulatory Visit: Payer: Worker's Compensation

## 2014-02-17 DIAGNOSIS — M869 Osteomyelitis, unspecified: Secondary | ICD-10-CM

## 2014-02-17 LAB — CBC WITH DIFFERENTIAL/PLATELET
Basophils Absolute: 0 10*3/uL (ref 0.0–0.1)
Basophils Relative: 0 % (ref 0–1)
Eosinophils Absolute: 0.1 10*3/uL (ref 0.0–0.7)
Eosinophils Relative: 3 % (ref 0–5)
HEMATOCRIT: 45.5 % (ref 39.0–52.0)
HEMOGLOBIN: 15.5 g/dL (ref 13.0–17.0)
LYMPHS ABS: 1.6 10*3/uL (ref 0.7–4.0)
Lymphocytes Relative: 34 % (ref 12–46)
MCH: 27.2 pg (ref 26.0–34.0)
MCHC: 34.1 g/dL (ref 30.0–36.0)
MCV: 80 fL (ref 78.0–100.0)
MONOS PCT: 9 % (ref 3–12)
Monocytes Absolute: 0.4 10*3/uL (ref 0.1–1.0)
NEUTROS ABS: 2.6 10*3/uL (ref 1.7–7.7)
NEUTROS PCT: 54 % (ref 43–77)
Platelets: 188 10*3/uL (ref 150–400)
RBC: 5.69 MIL/uL (ref 4.22–5.81)
RDW: 15.5 % (ref 11.5–15.5)
WBC: 4.8 10*3/uL (ref 4.0–10.5)

## 2014-02-17 LAB — COMPLETE METABOLIC PANEL WITH GFR
ALK PHOS: 54 U/L (ref 39–117)
ALT: 13 U/L (ref 0–53)
AST: 14 U/L (ref 0–37)
Albumin: 4.4 g/dL (ref 3.5–5.2)
BUN: 11 mg/dL (ref 6–23)
CHLORIDE: 102 meq/L (ref 96–112)
CO2: 30 meq/L (ref 19–32)
Calcium: 9.1 mg/dL (ref 8.4–10.5)
Creat: 1.09 mg/dL (ref 0.50–1.35)
GFR, Est Non African American: >89 mL/min
Glucose, Bld: 89 mg/dL (ref 70–99)
Potassium: 3.7 mEq/L (ref 3.5–5.3)
SODIUM: 140 meq/L (ref 135–145)
TOTAL PROTEIN: 6.7 g/dL (ref 6.0–8.3)
Total Bilirubin: 1.4 mg/dL — ABNORMAL HIGH (ref 0.2–1.2)

## 2014-03-02 ENCOUNTER — Ambulatory Visit (INDEPENDENT_AMBULATORY_CARE_PROVIDER_SITE_OTHER): Payer: Worker's Compensation | Admitting: Internal Medicine

## 2014-03-02 ENCOUNTER — Encounter: Payer: Self-pay | Admitting: Internal Medicine

## 2014-03-02 VITALS — BP 100/69 | HR 69 | Temp 97.8°F | Ht 66.0 in | Wt 170.0 lb

## 2014-03-02 DIAGNOSIS — M86172 Other acute osteomyelitis, left ankle and foot: Secondary | ICD-10-CM

## 2014-03-02 DIAGNOSIS — M86179 Other acute osteomyelitis, unspecified ankle and foot: Secondary | ICD-10-CM

## 2014-03-02 LAB — CBC WITH DIFFERENTIAL/PLATELET
Basophils Absolute: 0.1 10*3/uL (ref 0.0–0.1)
Basophils Relative: 1 % (ref 0–1)
Eosinophils Absolute: 0.2 10*3/uL (ref 0.0–0.7)
Eosinophils Relative: 4 % (ref 0–5)
HEMATOCRIT: 44.9 % (ref 39.0–52.0)
HEMOGLOBIN: 15.3 g/dL (ref 13.0–17.0)
LYMPHS ABS: 2.4 10*3/uL (ref 0.7–4.0)
Lymphocytes Relative: 44 % (ref 12–46)
MCH: 27.2 pg (ref 26.0–34.0)
MCHC: 34.1 g/dL (ref 30.0–36.0)
MCV: 79.8 fL (ref 78.0–100.0)
MONOS PCT: 10 % (ref 3–12)
Monocytes Absolute: 0.5 10*3/uL (ref 0.1–1.0)
NEUTROS ABS: 2.2 10*3/uL (ref 1.7–7.7)
NEUTROS PCT: 41 % — AB (ref 43–77)
Platelets: 247 10*3/uL (ref 150–400)
RBC: 5.63 MIL/uL (ref 4.22–5.81)
RDW: 15.3 % (ref 11.5–15.5)
WBC: 5.4 10*3/uL (ref 4.0–10.5)

## 2014-03-02 LAB — COMPLETE METABOLIC PANEL WITH GFR
ALT: 11 U/L (ref 0–53)
AST: 13 U/L (ref 0–37)
Albumin: 4.2 g/dL (ref 3.5–5.2)
Alkaline Phosphatase: 51 U/L (ref 39–117)
BILIRUBIN TOTAL: 1.1 mg/dL (ref 0.2–1.2)
BUN: 11 mg/dL (ref 6–23)
CO2: 28 meq/L (ref 19–32)
CREATININE: 0.87 mg/dL (ref 0.50–1.35)
Calcium: 9.4 mg/dL (ref 8.4–10.5)
Chloride: 104 mEq/L (ref 96–112)
Glucose, Bld: 89 mg/dL (ref 70–99)
Potassium: 3.6 mEq/L (ref 3.5–5.3)
Sodium: 141 mEq/L (ref 135–145)
Total Protein: 6.6 g/dL (ref 6.0–8.3)

## 2014-03-02 MED ORDER — LEVOFLOXACIN 750 MG PO TABS: 750.0000 mg | ORAL_TABLET | Freq: Every day | ORAL | Status: DC

## 2014-03-02 NOTE — Progress Notes (Signed)
   Subjective:    Patient ID: [name], male    DOB: [birth year], 28 y.o.   MRN: [id number]  HPI   Subjective:    Patient ID: [name], male    DOB: [birth year], 28 y.o.   MRN: [id number]  HPI  [name] is a 28 y.o. male with left foot crush injury 09/04/13, open, between forklift and metal mold. He had 1st MTP dislocation, IP dislocation, 3rd MT fracture, comminuted 5th MT fracture and 5th toe phalanx fracture. He required pin fixation, plate fixation and was doing well but developed erosive changes 2 months later with drainage. Xray noted loosening and erosion and started on doxycycline. He came in for debridement by Dr. Ophelia CharterYates. Culture grew pansensitive Pseudomonas. He was started on levaquin and plan for 6-8 weeks. Still has pin in place, beads placed with anticipation to remove.  No fever, no chills.  No drainage, less edema. He is now nearly 7 weeks into his antibiotics.  Tolerating well.  The mother again has multiple questions related to the timing of surgery, wound care, how to know if it is healed.      Review of Systems  Constitutional: Negative for fever and chills.  Cardiovascular: Negative for leg swelling.  Gastrointestinal: Negative for nausea and diarrhea.  Skin: Negative for rash. Wound: no drainage.       Objective:   Physical Exam  Constitutional: He appears well-developed and well-nourished. No distress.  Musculoskeletal: He exhibits no edema.  Right foot with pin, no edema, no erythema   Skin: No rash noted.  Psychiatric: He has a normal mood and affect.          Assessment & Plan:     Review of Systems     Objective:   Physical Exam        Assessment & Plan:

## 2014-03-02 NOTE — Assessment & Plan Note (Signed)
Doing well.  No drainage.  Questions answered regarding infeciton.  Deferred questions regarding surgery.     I plan to continue him on levaquin with rod in place.    RTC after surgery appt.  Labs today.

## 2014-03-09 ENCOUNTER — Telehealth: Payer: Self-pay | Admitting: *Deleted

## 2014-03-09 NOTE — Telephone Encounter (Signed)
Note from 3/6 available, faxing today. Note from 3/27 has not yet been transcribed. Front dest sent message requesting transcription to fax us a copy of the note when they are done.  Previous Messages      ----- Message -----  From: Gardiner Barefootobert W Comer, MD  Sent: 03/08/2014 8:51 AM  To: [facility] Triage Nurse Pool   Could you get a copy of Dr. Annell GreeningMark Yates last office note? thanks

## 2014-03-11 ENCOUNTER — Telehealth: Payer: Self-pay | Admitting: *Deleted

## 2014-03-11 ENCOUNTER — Other Ambulatory Visit: Payer: Self-pay | Admitting: *Deleted

## 2014-03-11 MED ORDER — LEVOFLOXACIN 750 MG PO TABS: 750.0000 mg | ORAL_TABLET | Freq: Every day | ORAL | Status: DC

## 2014-03-11 NOTE — Telephone Encounter (Signed)
Patient notified and Rx sent to [facility] on [address] Wendall MolaJacqueline Cockerham

## 2014-03-11 NOTE — Telephone Encounter (Signed)
Message copied by Macy MisOCKERHAM, JACQUELINE A on Thu Mar 11, 2014  4:57 PM ------      Message from: Gardiner BarefootOMER, ROBERT W      Created: Thu Mar 11, 2014 12:30 PM       He needs a refill of the levaquin but no pharmacy listed if you could ask him.  After he finishes what he has now (about another weeks or so), he should get a refill of the same 750 mg levaquin for 14 days with no further refills at this time. Thanks  ------

## 2014-05-07 ENCOUNTER — Inpatient Hospital Stay (HOSPITAL_COMMUNITY)
Admission: RE | Admit: 2014-05-07 | Payer: No Typology Code available for payment source | Source: Ambulatory Visit | Admitting: Orthopaedic Surgery

## 2014-05-07 ENCOUNTER — Encounter (HOSPITAL_COMMUNITY): Admission: RE | Payer: Self-pay | Source: Ambulatory Visit

## 2014-05-07 SURGERY — IRRIGATION AND DEBRIDEMENT EXTREMITY
Anesthesia: General | Site: Foot | Laterality: Left

## 2014-07-01 ENCOUNTER — Other Ambulatory Visit (HOSPITAL_COMMUNITY): Payer: Self-pay | Admitting: Orthopaedic Surgery

## 2014-07-01 ENCOUNTER — Encounter (HOSPITAL_COMMUNITY): Payer: Self-pay | Admitting: Pharmacy Technician

## 2014-07-06 ENCOUNTER — Other Ambulatory Visit (HOSPITAL_COMMUNITY): Payer: Self-pay | Admitting: *Deleted

## 2014-07-06 NOTE — Pre-Procedure Instructions (Signed)
[name]  07/06/2014   Your procedure is scheduled on:  Monday, July 12, 2014 15 3:00 PM.   Report to [facility] Entrance "A" Admitting Office at 1:00 PM.   Call this number if you have problems the morning of surgery: [phone]   Remember:   Do not eat food or drink liquids after midnight Sunday, 07/11/14.   Take these medicines the morning of surgery with A SIP OF WATER: HYDROcodone-acetaminophen (NORCO/VICODIN) - if needed    Do not wear jewelry.  Do not wear lotions, powders, or cologne. You may wear deodorant.  Men may shave face and neck.  Do not bring valuables to the hospital.  [facility] is not responsible                  for any belongings or valuables.               Contacts, dentures or bridgework may not be worn into surgery.  Leave suitcase in the car. After surgery it may be brought to your room.  For patients admitted to the hospital, discharge time is determined by your                treatment team.               Special Instructions: [place] - Preparing for Surgery  Before surgery, you can play an important role.  Because skin is not sterile, your skin needs to be as free of germs as possible.  You can reduce the number of germs on you skin by washing with CHG (chlorahexidine gluconate) soap before surgery.  CHG is an antiseptic cleaner which kills germs and bonds with the skin to continue killing germs even after washing.  Please DO NOT use if you have an allergy to CHG or antibacterial soaps.  If your skin becomes reddened/irritated stop using the CHG and inform your nurse when you arrive at Short Stay.  Do not shave (including legs and underarms) for at least 48 hours prior to the first CHG shower.  You may shave your face.  Please follow these instructions carefully:   1.  Shower with CHG Soap the night before surgery and the                                morning of Surgery.  2.  If you choose to wash your hair, wash your hair first  as usual with your       normal shampoo.  3.  After you shampoo, rinse your hair and body thoroughly to remove the                      Shampoo.  4.  Use CHG as you would any other liquid soap.  You can apply chg directly       to the skin and wash gently with scrungie or a clean washcloth.  5.  Apply the CHG Soap to your body ONLY FROM THE NECK DOWN.        Do not use on open wounds or open sores.  Avoid contact with your eyes, ears, mouth and genitals (private parts).  Wash genitals (private parts) with your normal soap.  6.  Wash thoroughly, paying special attention to the area where your surgery        will be performed.  7.  Thoroughly rinse your body  with warm water from the neck down.  8.  DO NOT shower/wash with your normal soap after using and rinsing off       the CHG Soap.  9.  Pat yourself dry with a clean towel.            10.  Wear clean pajamas.            11.  Place clean sheets on your bed the night of your first shower and do not        sleep with pets.  Day of Surgery  Do not apply any lotions the morning of surgery.  Please wear clean clothes to the hospital/surgery center.     Please read over the following fact sheets that you were given: Pain Booklet, Coughing and Deep Breathing and Surgical Site Infection Prevention

## 2014-07-07 ENCOUNTER — Encounter (HOSPITAL_COMMUNITY): Payer: Self-pay

## 2014-07-07 ENCOUNTER — Encounter (HOSPITAL_COMMUNITY)
Admission: RE | Admit: 2014-07-07 | Discharge: 2014-07-07 | Disposition: A | Discharge status: Home / Self Care | Payer: Worker's Compensation | Source: Ambulatory Visit | Attending: Orthopaedic Surgery | Admitting: Orthopaedic Surgery

## 2014-07-07 DIAGNOSIS — Z01818 Encounter for other preprocedural examination: Secondary | ICD-10-CM | POA: Insufficient documentation

## 2014-07-07 DIAGNOSIS — Z01812 Encounter for preprocedural laboratory examination: Secondary | ICD-10-CM | POA: Insufficient documentation

## 2014-07-07 HISTORY — DX: Other complications of anesthesia, initial encounter: T88.59XA

## 2014-07-07 HISTORY — DX: Adverse effect of unspecified anesthetic, initial encounter: T41.45XA

## 2014-07-07 LAB — COMPREHENSIVE METABOLIC PANEL
ALT: 15 U/L (ref 0–53)
ANION GAP: 14 (ref 5–15)
AST: 19 U/L (ref 0–37)
Albumin: 4.1 g/dL (ref 3.5–5.2)
Alkaline Phosphatase: 61 U/L (ref 39–117)
BILIRUBIN TOTAL: 1.4 mg/dL — AB (ref 0.3–1.2)
BUN: 11 mg/dL (ref 6–23)
CHLORIDE: 104 meq/L (ref 96–112)
CO2: 26 meq/L (ref 19–32)
CREATININE: 1.02 mg/dL (ref 0.50–1.35)
Calcium: 9 mg/dL (ref 8.4–10.5)
GFR calc Af Amer: >90 mL/min (ref >90)
GFR calc non Af Amer: >90 mL/min (ref >90)
GLUCOSE: 94 mg/dL (ref 70–99)
Potassium: 3.9 mEq/L (ref 3.7–5.3)
Sodium: 144 mEq/L (ref 137–147)
Total Protein: 7 g/dL (ref 6.0–8.3)

## 2014-07-07 LAB — CBC
HEMATOCRIT: 43.5 % (ref 39.0–52.0)
Hemoglobin: 14.7 g/dL (ref 13.0–17.0)
MCH: 28.4 pg (ref 26.0–34.0)
MCHC: 33.8 g/dL (ref 30.0–36.0)
MCV: 84.1 fL (ref 78.0–100.0)
Platelets: 220 10*3/uL (ref 150–400)
RBC: 5.17 MIL/uL (ref 4.22–5.81)
RDW: 13.7 % (ref 11.5–15.5)
WBC: 4.3 10*3/uL (ref 4.0–10.5)

## 2014-07-11 MED ORDER — CEFAZOLIN SODIUM-DEXTROSE 2-3 GM-% IV SOLR
2.0000 g | INTRAVENOUS | Status: DC
Start: 2014-07-11 — End: 2014-07-12

## 2014-07-12 ENCOUNTER — Encounter (HOSPITAL_COMMUNITY): Payer: Worker's Compensation | Admitting: Anesthesiology

## 2014-07-12 ENCOUNTER — Inpatient Hospital Stay (HOSPITAL_COMMUNITY)
Admission: RE | Admit: 2014-07-12 | Discharge: 2014-07-13 | DRG: 505 | Disposition: A | Discharge status: Home / Self Care | Payer: Worker's Compensation | Source: Ambulatory Visit | Attending: Orthopaedic Surgery | Admitting: Orthopaedic Surgery

## 2014-07-12 ENCOUNTER — Encounter (HOSPITAL_COMMUNITY)
Admission: RE | Disposition: A | Discharge status: Home / Self Care | Payer: Self-pay | Source: Ambulatory Visit | Attending: Orthopaedic Surgery

## 2014-07-12 ENCOUNTER — Encounter (HOSPITAL_COMMUNITY): Payer: Self-pay | Admitting: *Deleted

## 2014-07-12 ENCOUNTER — Inpatient Hospital Stay (HOSPITAL_COMMUNITY): Payer: Worker's Compensation | Admitting: Anesthesiology

## 2014-07-12 DIAGNOSIS — Z87891 Personal history of nicotine dependence: Secondary | ICD-10-CM

## 2014-07-12 DIAGNOSIS — Z792 Long term (current) use of antibiotics: Secondary | ICD-10-CM

## 2014-07-12 DIAGNOSIS — M86672 Other chronic osteomyelitis, left ankle and foot: Secondary | ICD-10-CM | POA: Diagnosis present

## 2014-07-12 DIAGNOSIS — D573 Sickle-cell trait: Secondary | ICD-10-CM | POA: Diagnosis present

## 2014-07-12 DIAGNOSIS — M86679 Other chronic osteomyelitis, unspecified ankle and foot: Principal | ICD-10-CM | POA: Diagnosis present

## 2014-07-12 DIAGNOSIS — IMO0002 Reserved for concepts with insufficient information to code with codable children: Secondary | ICD-10-CM

## 2014-07-12 DIAGNOSIS — T148XXS Other injury of unspecified body region, sequela: Secondary | ICD-10-CM

## 2014-07-12 HISTORY — PX: I & D EXTREMITY: SHX5045

## 2014-07-12 SURGERY — IRRIGATION AND DEBRIDEMENT EXTREMITY
Anesthesia: General | Site: Foot | Laterality: Left

## 2014-07-12 MED ORDER — ZOLPIDEM TARTRATE 5 MG PO TABS: 5.0000 mg | ORAL_TABLET | Freq: Every evening | ORAL | Status: DC | PRN

## 2014-07-12 MED ORDER — VANCOMYCIN HCL 1000 MG IV SOLR
INTRAVENOUS | Status: DC | PRN
  Administered 2014-07-12: 1 g

## 2014-07-12 MED ORDER — PHENYLEPHRINE 40 MCG/ML (10ML) SYRINGE FOR IV PUSH (FOR BLOOD PRESSURE SUPPORT)
PREFILLED_SYRINGE | INTRAVENOUS | Status: AC
  Filled 2014-07-12: qty 10

## 2014-07-12 MED ORDER — HYDROCODONE-ACETAMINOPHEN 5-325 MG PO TABS
1.0000 | ORAL_TABLET | ORAL | Status: DC | PRN
  Administered 2014-07-12 – 2014-07-13 (×3): 2 via ORAL
  Filled 2014-07-12 (×4): qty 2

## 2014-07-12 MED ORDER — VANCOMYCIN HCL 1000 MG IV SOLR
INTRAVENOUS | Status: AC
  Filled 2014-07-12: qty 1000

## 2014-07-12 MED ORDER — METOCLOPRAMIDE HCL 10 MG PO TABS: 5.0000 mg | ORAL_TABLET | Freq: Three times a day (TID) | ORAL | Status: DC | PRN

## 2014-07-12 MED ORDER — ONDANSETRON HCL 4 MG PO TABS: 4.0000 mg | ORAL_TABLET | Freq: Four times a day (QID) | ORAL | Status: DC | PRN

## 2014-07-12 MED ORDER — MORPHINE SULFATE 2 MG/ML IJ SOLN
1.0000 mg | INTRAMUSCULAR | Status: DC | PRN
  Administered 2014-07-12 – 2014-07-13 (×2): 1 mg via INTRAVENOUS
  Filled 2014-07-12 (×2): qty 1

## 2014-07-12 MED ORDER — PROPOFOL 10 MG/ML IV BOLUS
INTRAVENOUS | Status: AC
  Filled 2014-07-12: qty 20

## 2014-07-12 MED ORDER — 0.9 % SODIUM CHLORIDE (POUR BTL) OPTIME
TOPICAL | Status: DC | PRN
  Administered 2014-07-12: 1000 mL

## 2014-07-12 MED ORDER — HYDROMORPHONE HCL PF 1 MG/ML IJ SOLN
INTRAMUSCULAR | Status: AC
  Filled 2014-07-12: qty 1

## 2014-07-12 MED ORDER — METHOCARBAMOL 500 MG PO TABS
ORAL_TABLET | ORAL | Status: AC
  Filled 2014-07-12: qty 1

## 2014-07-12 MED ORDER — ONDANSETRON HCL 4 MG/2ML IJ SOLN
INTRAMUSCULAR | Status: AC
  Filled 2014-07-12: qty 2

## 2014-07-12 MED ORDER — LIDOCAINE HCL (CARDIAC) 20 MG/ML IV SOLN
INTRAVENOUS | Status: DC | PRN
  Administered 2014-07-12: 100 mg via INTRAVENOUS

## 2014-07-12 MED ORDER — GLYCOPYRROLATE 0.2 MG/ML IJ SOLN
INTRAMUSCULAR | Status: AC
Start: 2014-07-12 — End: 2014-07-12
  Filled 2014-07-12: qty 2

## 2014-07-12 MED ORDER — MIDAZOLAM HCL 2 MG/2ML IJ SOLN
INTRAMUSCULAR | Status: AC
  Filled 2014-07-12: qty 2

## 2014-07-12 MED ORDER — TOBRAMYCIN SULFATE 1.2 G IJ SOLR
INTRAMUSCULAR | Status: AC
  Filled 2014-07-12: qty 1.2

## 2014-07-12 MED ORDER — FLEET ENEMA 7-19 GM/118ML RE ENEM: 1.0000 | ENEMA | Freq: Once | RECTAL | Status: AC | PRN

## 2014-07-12 MED ORDER — PHENYLEPHRINE HCL 10 MG/ML IJ SOLN
INTRAMUSCULAR | Status: DC | PRN
  Administered 2014-07-12 (×5): 80 ug via INTRAVENOUS

## 2014-07-12 MED ORDER — TOBRAMYCIN SULFATE 1.2 G IJ SOLR
INTRAMUSCULAR | Status: DC | PRN
  Administered 2014-07-12: 1.2 g

## 2014-07-12 MED ORDER — ASPIRIN 325 MG PO TABS
325.0000 mg | ORAL_TABLET | Freq: Every day | ORAL | Status: DC
  Administered 2014-07-13: 325 mg via ORAL
  Filled 2014-07-12: qty 1

## 2014-07-12 MED ORDER — ROCURONIUM BROMIDE 50 MG/5ML IV SOLN
INTRAVENOUS | Status: AC
  Filled 2014-07-12: qty 1

## 2014-07-12 MED ORDER — LIDOCAINE HCL (CARDIAC) 20 MG/ML IV SOLN
INTRAVENOUS | Status: AC
  Filled 2014-07-12: qty 5

## 2014-07-12 MED ORDER — OXYCODONE-ACETAMINOPHEN 5-325 MG PO TABS
ORAL_TABLET | ORAL | Status: AC
  Filled 2014-07-12: qty 2

## 2014-07-12 MED ORDER — ONDANSETRON HCL 4 MG/2ML IJ SOLN: 4.0000 mg | Freq: Four times a day (QID) | INTRAMUSCULAR | Status: DC | PRN

## 2014-07-12 MED ORDER — METHOCARBAMOL 500 MG PO TABS
500.0000 mg | ORAL_TABLET | Freq: Four times a day (QID) | ORAL | Status: DC | PRN
  Administered 2014-07-12 – 2014-07-13 (×3): 500 mg via ORAL
  Filled 2014-07-12 (×2): qty 1

## 2014-07-12 MED ORDER — METHOCARBAMOL 1000 MG/10ML IJ SOLN
500.0000 mg | Freq: Four times a day (QID) | INTRAVENOUS | Status: DC | PRN
  Filled 2014-07-12: qty 5

## 2014-07-12 MED ORDER — MIDAZOLAM HCL 5 MG/5ML IJ SOLN
INTRAMUSCULAR | Status: DC | PRN
  Administered 2014-07-12: 2 mg via INTRAVENOUS

## 2014-07-12 MED ORDER — ONDANSETRON HCL 4 MG/2ML IJ SOLN
INTRAMUSCULAR | Status: DC | PRN
  Administered 2014-07-12: 4 mg via INTRAVENOUS

## 2014-07-12 MED ORDER — KETOROLAC TROMETHAMINE 30 MG/ML IJ SOLN
30.0000 mg | Freq: Once | INTRAMUSCULAR | Status: AC
  Administered 2014-07-12: 30 mg via INTRAVENOUS

## 2014-07-12 MED ORDER — OXYCODONE HCL 5 MG PO TABS
5.0000 mg | ORAL_TABLET | Freq: Once | ORAL | Status: AC | PRN
  Administered 2014-07-12: 5 mg via ORAL

## 2014-07-12 MED ORDER — MEPERIDINE HCL 25 MG/ML IJ SOLN: 6.2500 mg | INTRAMUSCULAR | Status: DC | PRN

## 2014-07-12 MED ORDER — NEOSTIGMINE METHYLSULFATE 10 MG/10ML IV SOLN
INTRAVENOUS | Status: AC
  Filled 2014-07-12: qty 1

## 2014-07-12 MED ORDER — FENTANYL CITRATE 0.05 MG/ML IJ SOLN
INTRAMUSCULAR | Status: DC | PRN
  Administered 2014-07-12 (×2): 25 ug via INTRAVENOUS
  Administered 2014-07-12: 100 ug via INTRAVENOUS

## 2014-07-12 MED ORDER — KCL IN DEXTROSE-NACL 20-5-0.45 MEQ/L-%-% IV SOLN
INTRAVENOUS | Status: AC
  Filled 2014-07-12: qty 1000

## 2014-07-12 MED ORDER — DOCUSATE SODIUM 100 MG PO CAPS
100.0000 mg | ORAL_CAPSULE | Freq: Two times a day (BID) | ORAL | Status: DC
  Administered 2014-07-12 – 2014-07-13 (×2): 100 mg via ORAL
  Filled 2014-07-12 (×2): qty 1

## 2014-07-12 MED ORDER — KETOROLAC TROMETHAMINE 30 MG/ML IJ SOLN
INTRAMUSCULAR | Status: AC
  Filled 2014-07-12: qty 1

## 2014-07-12 MED ORDER — POLYETHYLENE GLYCOL 3350 17 G PO PACK: 17.0000 g | PACK | Freq: Every day | ORAL | Status: DC | PRN

## 2014-07-12 MED ORDER — FENTANYL CITRATE 0.05 MG/ML IJ SOLN
INTRAMUSCULAR | Status: AC
  Filled 2014-07-12: qty 5

## 2014-07-12 MED ORDER — PROPOFOL 10 MG/ML IV BOLUS
INTRAVENOUS | Status: DC | PRN
  Administered 2014-07-12: 200 mg via INTRAVENOUS

## 2014-07-12 MED ORDER — LACTATED RINGERS IV SOLN
INTRAVENOUS | Status: DC
  Administered 2014-07-12 (×2): via INTRAVENOUS

## 2014-07-12 MED ORDER — ONDANSETRON HCL 4 MG/2ML IJ SOLN: 4.0000 mg | Freq: Once | INTRAMUSCULAR | Status: DC | PRN

## 2014-07-12 MED ORDER — METOCLOPRAMIDE HCL 5 MG/ML IJ SOLN: 5.0000 mg | Freq: Three times a day (TID) | INTRAMUSCULAR | Status: DC | PRN

## 2014-07-12 MED ORDER — OXYCODONE HCL 5 MG/5ML PO SOLN: 5.0000 mg | Freq: Once | ORAL | Status: AC | PRN

## 2014-07-12 MED ORDER — KCL IN DEXTROSE-NACL 20-5-0.45 MEQ/L-%-% IV SOLN
INTRAVENOUS | Status: DC
  Administered 2014-07-12: 75 mL/h via INTRAVENOUS
  Filled 2014-07-12 (×3): qty 1000

## 2014-07-12 MED ORDER — HYDROMORPHONE HCL PF 1 MG/ML IJ SOLN
0.2500 mg | INTRAMUSCULAR | Status: DC | PRN
  Administered 2014-07-12 (×4): 0.5 mg via INTRAVENOUS

## 2014-07-12 MED ORDER — OXYCODONE HCL 5 MG PO TABS
ORAL_TABLET | ORAL | Status: AC
Start: 2014-07-12 — End: 2014-07-13
  Filled 2014-07-12: qty 1

## 2014-07-12 MED ORDER — CEFAZOLIN SODIUM-DEXTROSE 2-3 GM-% IV SOLR
INTRAVENOUS | Status: AC
  Administered 2014-07-12: 2 g via INTRAVENOUS
  Filled 2014-07-12: qty 50

## 2014-07-12 MED ORDER — BISACODYL 10 MG RE SUPP: 10.0000 mg | Freq: Every day | RECTAL | Status: DC | PRN

## 2014-07-12 MED ORDER — OXYCODONE-ACETAMINOPHEN 5-325 MG PO TABS
1.0000 | ORAL_TABLET | ORAL | Status: DC | PRN
  Administered 2014-07-12 – 2014-07-13 (×5): 2 via ORAL
  Filled 2014-07-12 (×5): qty 2

## 2014-07-12 MED ORDER — LEVOFLOXACIN 750 MG PO TABS
750.0000 mg | ORAL_TABLET | Freq: Every day | ORAL | Status: DC
  Administered 2014-07-12 – 2014-07-13 (×2): 750 mg via ORAL
  Filled 2014-07-12 (×2): qty 1

## 2014-07-12 SURGICAL SUPPLY — 51 items
BAG DECANTER FOR FLEXI CONT (MISCELLANEOUS) ×1 IMPLANT
BANDAGE ELASTIC 4 VELCRO ST LF (GAUZE/BANDAGES/DRESSINGS) ×3 IMPLANT
BNDG CMPR 9X4 STRL LF SNTH (GAUZE/BANDAGES/DRESSINGS) ×1
BNDG COHESIVE 4X5 TAN STRL (GAUZE/BANDAGES/DRESSINGS) ×3 IMPLANT
BNDG ESMARK 4X9 LF (GAUZE/BANDAGES/DRESSINGS) ×2 IMPLANT
BNDG GAUZE ELAST 4 BULKY (GAUZE/BANDAGES/DRESSINGS) ×3 IMPLANT
CANISTER SUCTION 2500CC (MISCELLANEOUS) ×2 IMPLANT
CEMENT HV SMART SET (Cement) ×2 IMPLANT
COVER SURGICAL LIGHT HANDLE (MISCELLANEOUS) ×3 IMPLANT
CUFF TOURNIQUET SINGLE 18IN (TOURNIQUET CUFF) ×3 IMPLANT
DRAPE U-SHAPE 47X51 STRL (DRAPES) ×2 IMPLANT
DRSG EMULSION OIL 3X3 NADH (GAUZE/BANDAGES/DRESSINGS) ×3 IMPLANT
DRSG PAD ABDOMINAL 8X10 ST (GAUZE/BANDAGES/DRESSINGS) ×4 IMPLANT
ELECT REM PT RETURN 9FT ADLT (ELECTROSURGICAL)
ELECTRODE REM PT RTRN 9FT ADLT (ELECTROSURGICAL) IMPLANT
GAUZE SPONGE 4X4 12PLY STRL (GAUZE/BANDAGES/DRESSINGS) ×3 IMPLANT
GAUZE XEROFORM 5X9 LF (GAUZE/BANDAGES/DRESSINGS) ×3 IMPLANT
GLOVE BIO SURGEON STRL SZ 6.5 (GLOVE) ×1 IMPLANT
GLOVE BIO SURGEONS STRL SZ 6.5 (GLOVE) ×1
GLOVE BIOGEL PI IND STRL 6.5 (GLOVE) IMPLANT
GLOVE BIOGEL PI IND STRL 8 (GLOVE) ×1 IMPLANT
GLOVE BIOGEL PI INDICATOR 6.5 (GLOVE) ×2
GLOVE BIOGEL PI INDICATOR 8 (GLOVE) ×2
GLOVE ORTHO TXT STRL SZ7.5 (GLOVE) ×3 IMPLANT
GOWN STRL REUS W/ TWL LRG LVL3 (GOWN DISPOSABLE) ×2 IMPLANT
GOWN STRL REUS W/ TWL XL LVL3 (GOWN DISPOSABLE) ×1 IMPLANT
GOWN STRL REUS W/TWL LRG LVL3 (GOWN DISPOSABLE) ×6
GOWN STRL REUS W/TWL XL LVL3 (GOWN DISPOSABLE) ×3
HANDPIECE INTERPULSE COAX TIP (DISPOSABLE)
KIT BASIN OR (CUSTOM PROCEDURE TRAY) ×3 IMPLANT
KIT ROOM TURNOVER OR (KITS) ×3 IMPLANT
MANIFOLD NEPTUNE II (INSTRUMENTS) ×1 IMPLANT
NS IRRIG 1000ML POUR BTL (IV SOLUTION) ×3 IMPLANT
PACK ORTHO EXTREMITY (CUSTOM PROCEDURE TRAY) ×3 IMPLANT
PAD ARMBOARD 7.5X6 YLW CONV (MISCELLANEOUS) ×6 IMPLANT
SET HNDPC FAN SPRY TIP SCT (DISPOSABLE) IMPLANT
SLEEVE SURGEON STRL (DRAPES) ×2 IMPLANT
SPONGE GAUZE 4X4 12PLY STER LF (GAUZE/BANDAGES/DRESSINGS) ×2 IMPLANT
SPONGE LAP 18X18 X RAY DECT (DISPOSABLE) ×3 IMPLANT
SPONGE LAP 4X18 X RAY DECT (DISPOSABLE) ×3 IMPLANT
STOCKINETTE IMPERVIOUS 9X36 MD (GAUZE/BANDAGES/DRESSINGS) ×1 IMPLANT
SUT ETHILON 2 0 FS 18 (SUTURE) ×4 IMPLANT
SUT ETHILON 4 0 PS 2 18 (SUTURE) ×12 IMPLANT
TOWEL OR 17X24 6PK STRL BLUE (TOWEL DISPOSABLE) ×3 IMPLANT
TOWEL OR 17X26 10 PK STRL BLUE (TOWEL DISPOSABLE) ×3 IMPLANT
TUBE ANAEROBIC SPECIMEN COL (MISCELLANEOUS) IMPLANT
TUBE CONNECTING 12'X1/4 (SUCTIONS) ×1
TUBE CONNECTING 12X1/4 (SUCTIONS) ×2 IMPLANT
UNDERPAD 30X30 INCONTINENT (UNDERPADS AND DIAPERS) ×3 IMPLANT
WATER STERILE IRR 1000ML POUR (IV SOLUTION) ×3 IMPLANT
YANKAUER SUCT BULB TIP NO VENT (SUCTIONS) ×3 IMPLANT

## 2014-07-12 NOTE — Brief Op Note (Signed)
07/12/2014  5:13 PM  PATIENT:  [name]  28 y.o. male  PRE-OPERATIVE DIAGNOSIS:  Left Foot Osteomyelitis with Antibiotic Spacers  POST-OPERATIVE DIAGNOSIS:  Left foot Osteomyelitis with antibiotic spacers  PROCEDURE:  Procedure(s) with comments: IRRIGATION AND DEBRIDEMENT EXTREMITY (Left) - Left Foot 1st Metatarsal Removal Antibiotic Cement Spacers, Bone Debridement, and Placement of Longer Antibiotic Cement Spacer  SURGEON:  Surgeon(s) and Role:    * Eldred MangesMark C Yates, MD - Primary  PHYSICIAN ASSISTANT: none  ASSISTANTS: none   ANESTHESIA:   general  EBL:  Total I/O In: 1200 [I.V.:1200] Out: 50 [Blood:50]  BLOOD ADMINISTERED:none  DRAINS: none   LOCAL MEDICATIONS USED: none  SPECIMEN:  No Specimen  DISPOSITION OF SPECIMEN:  N/A  COUNTS:  YES  TOURNIQUET:   Total Tourniquet Time Documented: Thigh (Left) - 17 minutes Total: Thigh (Left) - 17 minutes   DICTATION: .Note written in EPIC  PLAN OF CARE: Admit to inpatient   PATIENT DISPOSITION:  PACU - hemodynamically stable.   Delay start of Pharmacological VTE agent (>24hrs) due to surgical blood loss or risk of bleeding: no

## 2014-07-12 NOTE — Anesthesia Procedure Notes (Signed)
Procedure Name: LMA Insertion Date/Time: 07/12/2014 4:20 PM Performed by: Jerilee HohMUMM, Matylda Fehring N Pre-anesthesia Checklist: Patient identified, Emergency Drugs available, Suction available and Patient being monitored Patient Re-evaluated:Patient Re-evaluated prior to inductionOxygen Delivery Method: Circle system utilized Preoxygenation: Pre-oxygenation with 100% oxygen Intubation Type: IV induction LMA: LMA inserted LMA Size: 5.0 Tube type: Oral Number of attempts: 1 Placement Confirmation: positive ETCO2 and breath sounds checked- equal and bilateral Tube secured with: Tape Dental Injury: Teeth and Oropharynx as per pre-operative assessment

## 2014-07-12 NOTE — Transfer of Care (Signed)
Immediate Anesthesia Transfer of Care Note  Patient: [name]  Procedure(s) Performed: Procedure(s) with comments: IRRIGATION AND DEBRIDEMENT EXTREMITY (Left) - Left Foot 1st Metatarsal Removal Antibiotic Cement Spacers, Bone Debridement, and Placement of Longer Antibiotic Cement Spacer  Patient Location: PACU  Anesthesia Type:General  Level of Consciousness: awake, alert , oriented and patient cooperative  Airway & Oxygen Therapy: Patient Spontanous Breathing and Patient connected to nasal cannula oxygen  Post-op Assessment: Report given to PACU RN, Post -op Vital signs reviewed and stable and Patient moving all extremities  Post vital signs: Reviewed and stable  Complications: No apparent anesthesia complications
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
## 2014-07-12 NOTE — Progress Notes (Signed)
Utilization review complete 

## 2014-07-12 NOTE — Anesthesia Postprocedure Evaluation (Signed)
  Anesthesia Post-op Note  Patient: [name]  Procedure(s) Performed: Procedure(s) with comments: IRRIGATION AND DEBRIDEMENT EXTREMITY (Left) - Left Foot 1st Metatarsal Removal Antibiotic Cement Spacers, Bone Debridement, and Placement of Longer Antibiotic Cement Spacer  Patient Location: PACU  Anesthesia Type:General  Level of Consciousness: awake, alert  and oriented  Airway and Oxygen Therapy: Patient Spontanous Breathing and Patient connected to nasal cannula oxygen  Post-op Pain: mild  Post-op Assessment: Post-op Vital signs reviewed, Patient's Cardiovascular Status Stable, Respiratory Function Stable, Patent Airway, No signs of Nausea or vomiting and Pain level controlled  Post-op Vital Signs: stable  Last Vitals:  Filed Vitals:   07/12/14 1839  BP:   Pulse: 72  Temp:   Resp: 11    Complications: No apparent anesthesia complications

## 2014-07-12 NOTE — Interval H&P Note (Signed)
History and Physical Interval Note:  07/12/2014 4:00 PM  Nayden L Basilia JumboCovington  has presented today for surgery, with the diagnosis of Left Foot Osteomyelitis with Antibiotic Spacers  The various methods of treatment have been discussed with the patient and family. After consideration of risks, benefits and other options for treatment, the patient has consented to  Procedure(s) with comments: IRRIGATION AND DEBRIDEMENT EXTREMITY (Left) - Left Foot 1st Metatarsal Removal Antibiotic Cement Spacers, Bone Debridement, and Placement of Longer Antibiotic Cement Spacer as a surgical intervention .  The patient's history has been reviewed, patient examined, no change in status, stable for surgery.  I have reviewed the patient's chart and labs.  Questions were answered to the patient's satisfaction.     Adelynn Gipe C
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
## 2014-07-12 NOTE — Anesthesia Preprocedure Evaluation (Addendum)
Anesthesia Evaluation  Patient identified by MRN, date of birth, ID band Patient awake    Reviewed: Allergy & Precautions  Airway Mallampati: I TM Distance: >3 FB Neck ROM: Full    Dental   Pulmonary former smoker,          Cardiovascular     Neuro/Psych    GI/Hepatic   Endo/Other    Renal/GU      Musculoskeletal   Abdominal   Peds  Hematology   Anesthesia Other Findings   Reproductive/Obstetrics                          Anesthesia Physical Anesthesia Plan  ASA: II  Anesthesia Plan: General   Post-op Pain Management:    Induction: Intravenous  Airway Management Planned: LMA  Additional Equipment:   Intra-op Plan:   Post-operative Plan: Extubation in OR  Informed Consent: I have reviewed the patients History and Physical, chart, labs and discussed the procedure including the risks, benefits and alternatives for the proposed anesthesia with the patient or authorized representative who has indicated his/her understanding and acceptance.     Plan Discussed with: CRNA and Surgeon  Anesthesia Plan Comments: (Pt allergic to eye lube. Will just tape eyes.)       Anesthesia Quick Evaluation

## 2014-07-13 MED ORDER — KETOROLAC TROMETHAMINE 30 MG/ML IJ SOLN
30.0000 mg | Freq: Once | INTRAMUSCULAR | Status: AC
  Administered 2014-07-13: 30 mg via INTRAVENOUS
  Filled 2014-07-13: qty 1

## 2014-07-13 MED ORDER — OXYCODONE-ACETAMINOPHEN 5-325 MG PO TABS: 1.0000 | ORAL_TABLET | ORAL | Status: DC | PRN

## 2014-07-13 MED ORDER — METHOCARBAMOL 500 MG PO TABS: 500.0000 mg | ORAL_TABLET | Freq: Four times a day (QID) | ORAL | Status: DC | PRN

## 2014-07-13 NOTE — Progress Notes (Signed)
Pt given discharge instructions; verbalized understanding of discharge teaching; scripts given; dressing to L foot clean dry and intact; pt discharged home via wheelchair with family

## 2014-07-13 NOTE — Progress Notes (Signed)
Subjective: 1 Day Post-Op Procedure(s) (LRB): IRRIGATION AND DEBRIDEMENT EXTREMITY (Left) Patient reports pain as moderate.   Walked to bathroom yesterday.   Pain controlled thus far with po meds  Objective: Vital signs in last 24 hours: Temp:  [97.2 F (36.2 C)-98.3 F (36.8 C)] 97.5 F (36.4 C) (08/04 0542) Pulse Rate:  [56-74] 61 (08/04 0542) Resp:  [6-18] 18 (08/04 0542) BP: (105-117)/(62-79) 108/62 mmHg (08/04 0542) SpO2:  [96 %-100 %] 98 % (08/04 0542) Weight:  [78.926 kg (174 lb)] 78.926 kg (174 lb) (08/03 1347)  Intake/Output from previous day: 08/03 0701 - 08/04 0700 In: 1501.3 [P.O.:220; I.V.:1281.3] Out: 350 [Urine:300; Blood:50] Intake/Output this shift:    No results found for this basename: HGB,  in the last 72 hours No results found for this basename: WBC, RBC, HCT, PLT,  in the last 72 hours No results found for this basename: NA, K, CL, CO2, BUN, CREATININE, GLUCOSE, CALCIUM,  in the last 72 hours No results found for this basename: LABPT, INR,  in the last 72 hours  Incision: dressing intact and dry.  cap refill of toes intact.  Assessment/Plan: 1 Day Post-Op Procedure(s) (LRB): IRRIGATION AND DEBRIDEMENT EXTREMITY (Left) Advance diet D/C IV fluids Dc home  Continue Levaquin as pre op rx percocet and robaxin OV 1 week  Makyna Niehoff M 07/13/2014, 8:06 AM

## 2014-07-13 NOTE — Discharge Instructions (Signed)
Weight bear as tolerated on left foot with post op shoe Elevation when at rest above heart Keep dressing dry and clean. Aspirin daily to prevent blood clots. Continue antibiotics as pre op

## 2014-07-15 ENCOUNTER — Encounter (HOSPITAL_COMMUNITY): Payer: Self-pay | Admitting: Orthopaedic Surgery

## 2014-07-15 LAB — WOUND CULTURE
Culture: NO GROWTH
Gram Stain: NONE SEEN

## 2014-07-17 LAB — ANAEROBIC CULTURE: Gram Stain: NONE SEEN

## 2014-08-03 NOTE — Discharge Summary (Signed)
Physician Discharge Summary  Patient ID: [name] MRN: [id number] DOB/AGE: 1986/03/29 28 y.o.  Admit date: 07/12/2014 Discharge date: 07/13/2014  Admission Diagnoses:  Chronic osteomyelitis of left foot Status post crush injury left foot, multiple procedures with retained polymethylmethacrylate antibiotic cement spacers  Discharge Diagnoses:  Principal Problem:   Chronic osteomyelitis of left foot Status post crush injury left foot, multiple procedures with retained polymethylmethacrylate antibiotic cement spacers    Past Medical History  Diagnosis Date  . Sickle cell trait   . Complication of anesthesia     LACRILUBE  REDNESS     Surgeries: Procedure(s): IRRIGATION AND DEBRIDEMENT EXTREMITY on 07/12/2014 Excision, resection of first metatarsal shaft, removal of  cement spacers and placement of new bridging vancomycin, tobramycin,  polymethylmethacrylate antibiotic spacer.   Consultants (if any):  none  Discharged Condition: Improved  Hospital Course: [name] is an 28 y.o. male who was admitted 07/12/2014 with a diagnosis of Chronic osteomyelitis of left foot and went to the operating room on 07/12/2014 and underwent the above named procedures.    He was given perioperative antibiotics:  Anti-infectives   Start     Dose/Rate Route Frequency Ordered Stop   07/12/14 2100  levofloxacin (LEVAQUIN) tablet 750 mg  Status:  Discontinued     750 mg Oral Daily 07/12/14 1945 07/13/14 1849   07/12/14 1707  vancomycin (VANCOCIN) powder  Status:  Discontinued       As needed 07/12/14 1708 07/12/14 1725   07/12/14 1706  tobramycin (NEBCIN) powder  Status:  Discontinued       As needed 07/12/14 1706 07/12/14 1725   07/12/14 1347  ceFAZolin (ANCEF) 2-3 GM-% IVPB SOLR    Comments:  Lenox Ahr   : cabinet override      07/12/14 1347 07/12/14 1622   07/11/14 1057  ceFAZolin (ANCEF) IVPB 2 g/50 mL premix  Status:  Discontinued     2 g 100 mL/hr over 30 Minutes Intravenous  On call to O.R. 07/11/14 1057 07/12/14 1945    .  He was given sequential compression devices, early ambulation, and aspirin for DVT prophylaxis.  He benefited maximally from the hospital stay and there were no complications.    Recent vital signs:  Filed Vitals:   07/13/14 0542  BP: 108/62  Pulse: 61  Temp: 97.5 F (36.4 C)  Resp: 18    Recent laboratory studies:  Lab Results  Component Value Date   HGB 14.7 07/07/2014   HGB 15.3 03/02/2014   HGB 15.5 02/17/2014   Lab Results  Component Value Date   WBC 4.3 07/07/2014   PLT 220 07/07/2014   Lab Results  Component Value Date   INR 1.00 01/06/2014   Lab Results  Component Value Date   NA 144 07/07/2014   K 3.9 07/07/2014   CL 104 07/07/2014   CO2 26 07/07/2014   BUN 11 07/07/2014   CREATININE 1.02 07/07/2014   GLUCOSE 94 07/07/2014    Discharge Medications:     Medication List         HYDROcodone-acetaminophen 5-325 MG per tablet  Commonly known as:  NORCO/VICODIN  Take 1 tablet by mouth every 4 (four) hours as needed for moderate pain.     levofloxacin 750 MG tablet  Commonly known as:  LEVAQUIN  Take 1 tablet (750 mg total) by mouth daily.     methocarbamol 500 MG tablet  Commonly known as:  ROBAXIN  Take 1 tablet (500 mg total) by mouth  every 6 (six) hours as needed for muscle spasms.     oxyCODONE-acetaminophen 5-325 MG per tablet  Commonly known as:  PERCOCET/ROXICET  Take 1-2 tablets by mouth every 4 (four) hours as needed for moderate pain.        Diagnostic Studies: No results found.  Disposition: 01-Home or Self Care      Discharge Instructions   Call MD / Call 911    Complete by:  As directed   If you experience chest pain or shortness of breath, CALL 911 and be transported to the hospital emergency room.  If you develope a fever above 101 F, pus (white drainage) or increased drainage or redness at the wound, or calf pain, call your surgeon's office.     Constipation Prevention    Complete  by:  As directed   Drink plenty of fluids.  Prune juice may be helpful.  You may use a stool softener, such as Colace (over the counter) 100 mg twice a day.  Use MiraLax (over the counter) for constipation as needed.     Diet - low sodium heart healthy    Complete by:  As directed      Increase activity slowly as tolerated    Complete by:  As directed          Weight bear as tolerated on left foot with post op shoe Elevation when at rest above heart Keep dressing dry and clean. Aspirin daily to prevent blood clots. Continue antibiotics as pre op  Follow-up Information   Follow up with Eldred Manges, MD. Schedule an appointment as soon as possible for a visit in 1 week.   Specialty:  Orthopedic Surgery   Contact information:   [address] [place] [address] [phone]        Signed: Wende Neighbors 08/03/2014, 12:48 PM

## 2014-09-13 ENCOUNTER — Other Ambulatory Visit (HOSPITAL_COMMUNITY): Payer: Self-pay | Admitting: Orthopaedic Surgery

## 2014-09-13 ENCOUNTER — Encounter (HOSPITAL_COMMUNITY): Payer: Self-pay | Admitting: Pharmacy Technician

## 2014-09-14 ENCOUNTER — Encounter (HOSPITAL_COMMUNITY): Payer: Self-pay | Admitting: *Deleted

## 2014-09-14 MED ORDER — CEFAZOLIN SODIUM-DEXTROSE 2-3 GM-% IV SOLR
2.0000 g | INTRAVENOUS | Status: AC
  Administered 2014-09-15: 2 g via INTRAVENOUS
  Filled 2014-09-14: qty 50

## 2014-09-15 ENCOUNTER — Observation Stay (HOSPITAL_COMMUNITY)
Admission: RE | Admit: 2014-09-15 | Discharge: 2014-09-18 | Disposition: A | Discharge status: Home / Self Care | Payer: Worker's Compensation | Source: Ambulatory Visit | Attending: Orthopaedic Surgery | Admitting: Orthopaedic Surgery

## 2014-09-15 ENCOUNTER — Encounter (HOSPITAL_COMMUNITY): Payer: Worker's Compensation | Admitting: Anesthesiology

## 2014-09-15 ENCOUNTER — Encounter (HOSPITAL_COMMUNITY)
Admission: RE | Disposition: A | Discharge status: Home / Self Care | Payer: Self-pay | Source: Ambulatory Visit | Attending: Orthopaedic Surgery

## 2014-09-15 ENCOUNTER — Encounter (HOSPITAL_COMMUNITY): Payer: Self-pay | Admitting: *Deleted

## 2014-09-15 ENCOUNTER — Inpatient Hospital Stay (HOSPITAL_COMMUNITY): Payer: Worker's Compensation | Admitting: Anesthesiology

## 2014-09-15 DIAGNOSIS — S92309B Fracture of unspecified metatarsal bone(s), unspecified foot, initial encounter for open fracture: Secondary | ICD-10-CM | POA: Diagnosis present

## 2014-09-15 DIAGNOSIS — S97102A Crushing injury of unspecified left toe(s), initial encounter: Secondary | ICD-10-CM | POA: Diagnosis present

## 2014-09-15 DIAGNOSIS — S9782XA Crushing injury of left foot, initial encounter: Secondary | ICD-10-CM | POA: Diagnosis present

## 2014-09-15 DIAGNOSIS — Z87891 Personal history of nicotine dependence: Secondary | ICD-10-CM | POA: Insufficient documentation

## 2014-09-15 DIAGNOSIS — M86672 Other chronic osteomyelitis, left ankle and foot: Secondary | ICD-10-CM | POA: Diagnosis present

## 2014-09-15 DIAGNOSIS — S9782XD Crushing injury of left foot, subsequent encounter: Principal | ICD-10-CM | POA: Insufficient documentation

## 2014-09-15 DIAGNOSIS — D573 Sickle-cell trait: Secondary | ICD-10-CM | POA: Insufficient documentation

## 2014-09-15 HISTORY — PX: ARTHRODESIS FOOT WITH ILIAC CREST BONE GRAFT: SHX5587

## 2014-09-15 LAB — CBC
HEMATOCRIT: 44.2 % (ref 39.0–52.0)
HEMOGLOBIN: 15 g/dL (ref 13.0–17.0)
MCH: 27.8 pg (ref 26.0–34.0)
MCHC: 33.9 g/dL (ref 30.0–36.0)
MCV: 81.9 fL (ref 78.0–100.0)
PLATELETS: 189 10*3/uL (ref 150–400)
RBC: 5.4 MIL/uL (ref 4.22–5.81)
RDW: 13.5 % (ref 11.5–15.5)
WBC: 4.5 10*3/uL (ref 4.0–10.5)

## 2014-09-15 LAB — COMPREHENSIVE METABOLIC PANEL
ALT: 12 U/L (ref 0–53)
AST: 17 U/L (ref 0–37)
Albumin: 4.2 g/dL (ref 3.5–5.2)
Alkaline Phosphatase: 66 U/L (ref 39–117)
Anion gap: 14 (ref 5–15)
BUN: 12 mg/dL (ref 6–23)
CHLORIDE: 104 meq/L (ref 96–112)
CO2: 25 mEq/L (ref 19–32)
CREATININE: 1.06 mg/dL (ref 0.50–1.35)
Calcium: 9.2 mg/dL (ref 8.4–10.5)
GFR calc non Af Amer: >90 mL/min (ref >90)
GLUCOSE: 108 mg/dL — AB (ref 70–99)
Potassium: 3.7 mEq/L (ref 3.7–5.3)
Sodium: 143 mEq/L (ref 137–147)
Total Bilirubin: 0.9 mg/dL (ref 0.3–1.2)
Total Protein: 7.6 g/dL (ref 6.0–8.3)

## 2014-09-15 LAB — URINALYSIS, ROUTINE W REFLEX MICROSCOPIC
BILIRUBIN URINE: NEGATIVE
Glucose, UA: NEGATIVE mg/dL
Hgb urine dipstick: NEGATIVE
Ketones, ur: NEGATIVE mg/dL
Leukocytes, UA: NEGATIVE
Nitrite: NEGATIVE
PH: 6 (ref 5.0–8.0)
Protein, ur: NEGATIVE mg/dL
SPECIFIC GRAVITY, URINE: 1.017 (ref 1.005–1.030)
Urobilinogen, UA: 0.2 mg/dL (ref 0.0–1.0)

## 2014-09-15 LAB — PROTIME-INR
INR: 0.98 (ref 0.00–1.49)
Prothrombin Time: 13 seconds (ref 11.6–15.2)

## 2014-09-15 SURGERY — FUSION, JOINT, FOOT, USING ILIAC CREST BONE GRAFT
Anesthesia: Regional | Site: Foot | Laterality: Left

## 2014-09-15 MED ORDER — FENTANYL CITRATE 0.05 MG/ML IJ SOLN
INTRAMUSCULAR | Status: AC
  Filled 2014-09-15: qty 2

## 2014-09-15 MED ORDER — HYDROCODONE-ACETAMINOPHEN 5-325 MG PO TABS: 1.0000 | ORAL_TABLET | ORAL | Status: DC | PRN

## 2014-09-15 MED ORDER — OXYCODONE HCL 5 MG/5ML PO SOLN: 5.0000 mg | Freq: Once | ORAL | Status: DC | PRN

## 2014-09-15 MED ORDER — PROPOFOL 10 MG/ML IV BOLUS
INTRAVENOUS | Status: DC | PRN
  Administered 2014-09-15: 200 mg via INTRAVENOUS

## 2014-09-15 MED ORDER — HYDROMORPHONE HCL 1 MG/ML IJ SOLN: 0.2500 mg | INTRAMUSCULAR | Status: DC | PRN

## 2014-09-15 MED ORDER — FENTANYL CITRATE 0.05 MG/ML IJ SOLN
INTRAMUSCULAR | Status: DC | PRN
  Administered 2014-09-15 (×5): 50 ug via INTRAVENOUS

## 2014-09-15 MED ORDER — ONDANSETRON HCL 4 MG/2ML IJ SOLN
INTRAMUSCULAR | Status: AC
  Filled 2014-09-15: qty 2

## 2014-09-15 MED ORDER — HYDROMORPHONE HCL 1 MG/ML IJ SOLN
0.5000 mg | INTRAMUSCULAR | Status: DC | PRN
  Administered 2014-09-15 – 2014-09-16 (×9): 1 mg via INTRAVENOUS
  Filled 2014-09-15 (×9): qty 1

## 2014-09-15 MED ORDER — ZOLPIDEM TARTRATE 5 MG PO TABS: 5.0000 mg | ORAL_TABLET | Freq: Every evening | ORAL | Status: DC | PRN

## 2014-09-15 MED ORDER — FLEET ENEMA 7-19 GM/118ML RE ENEM: 1.0000 | ENEMA | Freq: Once | RECTAL | Status: AC | PRN

## 2014-09-15 MED ORDER — ONDANSETRON HCL 4 MG/2ML IJ SOLN: 4.0000 mg | Freq: Four times a day (QID) | INTRAMUSCULAR | Status: DC | PRN

## 2014-09-15 MED ORDER — METOCLOPRAMIDE HCL 5 MG/ML IJ SOLN: 5.0000 mg | Freq: Three times a day (TID) | INTRAMUSCULAR | Status: DC | PRN

## 2014-09-15 MED ORDER — BISACODYL 10 MG RE SUPP: 10.0000 mg | Freq: Every day | RECTAL | Status: DC | PRN

## 2014-09-15 MED ORDER — MIDAZOLAM HCL 5 MG/5ML IJ SOLN
INTRAMUSCULAR | Status: DC | PRN
  Administered 2014-09-15: 2 mg via INTRAVENOUS

## 2014-09-15 MED ORDER — ONDANSETRON HCL 4 MG PO TABS: 4.0000 mg | ORAL_TABLET | Freq: Four times a day (QID) | ORAL | Status: DC | PRN

## 2014-09-15 MED ORDER — DOCUSATE SODIUM 100 MG PO CAPS
100.0000 mg | ORAL_CAPSULE | Freq: Two times a day (BID) | ORAL | Status: DC
  Administered 2014-09-15 – 2014-09-18 (×6): 100 mg via ORAL
  Filled 2014-09-15 (×9): qty 1

## 2014-09-15 MED ORDER — OXYCODONE-ACETAMINOPHEN 5-325 MG PO TABS
1.0000 | ORAL_TABLET | ORAL | Status: DC | PRN
  Administered 2014-09-15 – 2014-09-18 (×17): 2 via ORAL
  Filled 2014-09-15 (×17): qty 2

## 2014-09-15 MED ORDER — METHOCARBAMOL 500 MG PO TABS: 500.0000 mg | ORAL_TABLET | Freq: Four times a day (QID) | ORAL | Status: DC | PRN

## 2014-09-15 MED ORDER — HYDROMORPHONE HCL 1 MG/ML IJ SOLN
INTRAMUSCULAR | Status: AC
  Filled 2014-09-15: qty 1

## 2014-09-15 MED ORDER — BUPIVACAINE-EPINEPHRINE (PF) 0.5% -1:200000 IJ SOLN
INTRAMUSCULAR | Status: DC | PRN
  Administered 2014-09-15: 25 mL via PERINEURAL

## 2014-09-15 MED ORDER — METOCLOPRAMIDE HCL 10 MG PO TABS: 5.0000 mg | ORAL_TABLET | Freq: Three times a day (TID) | ORAL | Status: DC | PRN

## 2014-09-15 MED ORDER — METHOCARBAMOL 500 MG PO TABS
500.0000 mg | ORAL_TABLET | Freq: Four times a day (QID) | ORAL | Status: DC | PRN
  Administered 2014-09-16 – 2014-09-18 (×7): 500 mg via ORAL
  Filled 2014-09-15 (×9): qty 1

## 2014-09-15 MED ORDER — SUCCINYLCHOLINE CHLORIDE 20 MG/ML IJ SOLN
INTRAMUSCULAR | Status: AC
  Filled 2014-09-15: qty 1

## 2014-09-15 MED ORDER — POLYETHYLENE GLYCOL 3350 17 G PO PACK: 17.0000 g | PACK | Freq: Every day | ORAL | Status: DC | PRN

## 2014-09-15 MED ORDER — OXYCODONE HCL 5 MG PO TABS: 5.0000 mg | ORAL_TABLET | Freq: Once | ORAL | Status: DC | PRN

## 2014-09-15 MED ORDER — MIDAZOLAM HCL 2 MG/2ML IJ SOLN
INTRAMUSCULAR | Status: AC
  Filled 2014-09-15: qty 2

## 2014-09-15 MED ORDER — 0.9 % SODIUM CHLORIDE (POUR BTL) OPTIME
TOPICAL | Status: DC | PRN
  Administered 2014-09-15: 1000 mL

## 2014-09-15 MED ORDER — LIDOCAINE HCL (CARDIAC) 10 MG/ML IV SOLN
INTRAVENOUS | Status: DC | PRN
Start: 2014-09-15 — End: 2014-09-15
  Administered 2014-09-15: 50 mg via INTRAVENOUS

## 2014-09-15 MED ORDER — KCL IN DEXTROSE-NACL 20-5-0.45 MEQ/L-%-% IV SOLN
INTRAVENOUS | Status: DC
  Administered 2014-09-15 – 2014-09-16 (×2): via INTRAVENOUS
  Filled 2014-09-15 (×7): qty 1000

## 2014-09-15 MED ORDER — BUPIVACAINE LIPOSOME 1.3 % IJ SUSP
20.0000 mL | INTRAMUSCULAR | Status: DC
Start: 2014-09-15 — End: 2014-09-15
  Filled 2014-09-15: qty 20

## 2014-09-15 MED ORDER — BUPIVACAINE HCL (PF) 0.25 % IJ SOLN
INTRAMUSCULAR | Status: DC | PRN
  Administered 2014-09-15: 30 mL

## 2014-09-15 MED ORDER — OXYCODONE-ACETAMINOPHEN 5-325 MG PO TABS: 1.0000 | ORAL_TABLET | ORAL | Status: DC | PRN

## 2014-09-15 MED ORDER — FENTANYL CITRATE 0.05 MG/ML IJ SOLN
INTRAMUSCULAR | Status: AC
  Filled 2014-09-15: qty 5

## 2014-09-15 MED ORDER — BUPIVACAINE LIPOSOME 1.3 % IJ SUSP
INTRAMUSCULAR | Status: DC | PRN
  Administered 2014-09-15: 20 mL

## 2014-09-15 MED ORDER — BUPIVACAINE HCL (PF) 0.25 % IJ SOLN
INTRAMUSCULAR | Status: AC
  Filled 2014-09-15: qty 30

## 2014-09-15 MED ORDER — LIDOCAINE HCL (CARDIAC) 20 MG/ML IV SOLN
INTRAVENOUS | Status: AC
  Filled 2014-09-15: qty 5

## 2014-09-15 MED ORDER — METHOCARBAMOL 1000 MG/10ML IJ SOLN
500.0000 mg | Freq: Four times a day (QID) | INTRAMUSCULAR | Status: DC | PRN
  Administered 2014-09-15: 500 mg via INTRAVENOUS
  Filled 2014-09-15: qty 5

## 2014-09-15 MED ORDER — LACTATED RINGERS IV SOLN
INTRAVENOUS | Status: DC
  Administered 2014-09-15 (×2): via INTRAVENOUS

## 2014-09-15 MED ORDER — PROPOFOL 10 MG/ML IV BOLUS
INTRAVENOUS | Status: AC
  Filled 2014-09-15: qty 20

## 2014-09-15 MED ORDER — ONDANSETRON HCL 4 MG/2ML IJ SOLN
INTRAMUSCULAR | Status: DC | PRN
  Administered 2014-09-15: 4 mg via INTRAVENOUS

## 2014-09-15 MED ORDER — GENTAMICIN SULFATE 40 MG/ML IJ SOLN
2.0000 mg/kg | Freq: Once | INTRAVENOUS | Status: AC
  Administered 2014-09-15: 150 mg via INTRAVENOUS
  Filled 2014-09-15: qty 3.75

## 2014-09-15 SURGICAL SUPPLY — 70 items
3.0MM CARBON FIBER ROD 75MM ×2 IMPLANT
ADH SKN CLS APL DERMABOND .7 (GAUZE/BANDAGES/DRESSINGS) ×1
BANDAGE ELASTIC 4 VELCRO ST LF (GAUZE/BANDAGES/DRESSINGS) ×3 IMPLANT
BANDAGE ELASTIC 6 VELCRO ST LF (GAUZE/BANDAGES/DRESSINGS) ×3 IMPLANT
BANDAGE ESMARK 6X9 LF (GAUZE/BANDAGES/DRESSINGS) IMPLANT
BANDAGE GAUZE 4  KLING STR (GAUZE/BANDAGES/DRESSINGS) ×2 IMPLANT
BLADE AVERAGE 25MMX9MM (BLADE) ×1
BLADE AVERAGE 25X9 (BLADE) ×2 IMPLANT
BLADE SURG 10 STRL SS (BLADE) IMPLANT
BNDG CMPR 9X6 STRL LF SNTH (GAUZE/BANDAGES/DRESSINGS)
BNDG ESMARK 6X9 LF (GAUZE/BANDAGES/DRESSINGS)
BNDG GAUZE ELAST 4 BULKY (GAUZE/BANDAGES/DRESSINGS) ×6 IMPLANT
BUR SURG 4X8 MED (BURR) IMPLANT
BURR SURG 4MMX8MM MEDIUM (BURR)
BURR SURG 4X8 MED (BURR)
COTTON STERILE ROLL (GAUZE/BANDAGES/DRESSINGS) ×3 IMPLANT
COVER MAYO STAND STRL (DRAPES) IMPLANT
COVER SURGICAL LIGHT HANDLE (MISCELLANEOUS) ×3 IMPLANT
CUFF TOURNIQUET SINGLE 34IN LL (TOURNIQUET CUFF) ×2 IMPLANT
CUFF TOURNIQUET SINGLE 44IN (TOURNIQUET CUFF) IMPLANT
DERMABOND ADVANCED (GAUZE/BANDAGES/DRESSINGS) ×2
DERMABOND ADVANCED .7 DNX12 (GAUZE/BANDAGES/DRESSINGS) IMPLANT
DRAPE C-ARM 42X72 X-RAY (DRAPES) IMPLANT
DRAPE INCISE IOBAN 66X45 STRL (DRAPES) ×3 IMPLANT
DRAPE OEC MINIVIEW 54X84 (DRAPES) ×2 IMPLANT
DRAPE PROXIMA HALF (DRAPES) ×3 IMPLANT
DRAPE U-SHAPE 47X51 STRL (DRAPES) ×3 IMPLANT
DRSG MEPILEX BORDER 4X4 (GAUZE/BANDAGES/DRESSINGS) ×2 IMPLANT
DRSG PAD ABDOMINAL 8X10 ST (GAUZE/BANDAGES/DRESSINGS) ×2 IMPLANT
DURAPREP 26ML APPLICATOR (WOUND CARE) ×3 IMPLANT
ELECT REM PT RETURN 9FT ADLT (ELECTROSURGICAL) ×3
ELECTRODE REM PT RTRN 9FT ADLT (ELECTROSURGICAL) ×1 IMPLANT
GAUZE SPONGE 4X4 12PLY STRL (GAUZE/BANDAGES/DRESSINGS) ×3 IMPLANT
GAUZE XEROFORM 5X9 LF (GAUZE/BANDAGES/DRESSINGS) ×3 IMPLANT
GLOVE BIOGEL PI IND STRL 8 (GLOVE) ×1 IMPLANT
GLOVE BIOGEL PI INDICATOR 8 (GLOVE) ×2
GLOVE ORTHO TXT STRL SZ7.5 (GLOVE) ×3 IMPLANT
GOWN STRL REUS W/ TWL LRG LVL3 (GOWN DISPOSABLE) ×2 IMPLANT
GOWN STRL REUS W/ TWL XL LVL3 (GOWN DISPOSABLE) ×1 IMPLANT
GOWN STRL REUS W/TWL LRG LVL3 (GOWN DISPOSABLE) ×6
GOWN STRL REUS W/TWL XL LVL3 (GOWN DISPOSABLE) ×3
HOLDING CLAMPS 1.25MM ×4 IMPLANT
K-WIRE 1.25X100 (WIRE) ×8 IMPLANT
KIT BASIN OR (CUSTOM PROCEDURE TRAY) ×3 IMPLANT
KIT ROOM TURNOVER OR (KITS) ×3 IMPLANT
MANIFOLD NEPTUNE II (INSTRUMENTS) ×3 IMPLANT
NS IRRIG 1000ML POUR BTL (IV SOLUTION) ×3 IMPLANT
PACK ORTHO EXTREMITY (CUSTOM PROCEDURE TRAY) ×3 IMPLANT
PAD ARMBOARD 7.5X6 YLW CONV (MISCELLANEOUS) ×6 IMPLANT
PAD CAST 4YDX4 CTTN HI CHSV (CAST SUPPLIES) ×2 IMPLANT
PADDING CAST COTTON 4X4 STRL (CAST SUPPLIES) ×6
SPONGE GAUZE 4X4 12PLY STER LF (GAUZE/BANDAGES/DRESSINGS) ×2 IMPLANT
SPONGE LAP 18X18 X RAY DECT (DISPOSABLE) ×5 IMPLANT
SPONGE SURGIFOAM ABS GEL 100 (HEMOSTASIS) ×2 IMPLANT
STAPLER VISISTAT 35W (STAPLE) ×3 IMPLANT
SUCTION FRAZIER TIP 10 FR DISP (SUCTIONS) ×3 IMPLANT
SUT ETHILON 2 0 PSLX (SUTURE) ×9 IMPLANT
SUT VIC AB 0 CT1 27 (SUTURE) ×3
SUT VIC AB 0 CT1 27XBRD ANBCTR (SUTURE) IMPLANT
SUT VIC AB 1 CT1 27 (SUTURE) ×3
SUT VIC AB 1 CT1 27XBRD ANBCTR (SUTURE) IMPLANT
SUT VIC AB 2-0 CT1 27 (SUTURE) ×6
SUT VIC AB 2-0 CT1 TAPERPNT 27 (SUTURE) ×2 IMPLANT
SUT VIC AB 4-0 PS2 27 (SUTURE) ×2 IMPLANT
TOWEL OR 17X24 6PK STRL BLUE (TOWEL DISPOSABLE) ×3 IMPLANT
TOWEL OR 17X26 10 PK STRL BLUE (TOWEL DISPOSABLE) ×3 IMPLANT
TUBE CONNECTING 12'X1/4 (SUCTIONS) ×1
TUBE CONNECTING 12X1/4 (SUCTIONS) ×2 IMPLANT
WATER STERILE IRR 1000ML POUR (IV SOLUTION) ×3 IMPLANT
YANKAUER SUCT BULB TIP NO VENT (SUCTIONS) ×2 IMPLANT

## 2014-09-15 NOTE — Anesthesia Postprocedure Evaluation (Signed)
  Anesthesia Post-op Note  Patient: [name]  Procedure(s) Performed: Procedure(s): Left Foot Antibiotic Spacer Removal, 1st MTP Fusion, Left Iliac Crest Bone Graft (Left)  Patient Location: PACU  Anesthesia Type:General  Level of Consciousness: awake and alert   Airway and Oxygen Therapy: Patient Spontanous Breathing  Post-op Pain: mild  Post-op Assessment: Post-op Vital signs reviewed  Post-op Vital Signs: stable  Last Vitals:  Filed Vitals:   09/15/14 1600  BP: 115/64  Pulse: 69  Temp: 36.7 C  Resp: 10    Complications: No apparent anesthesia complications

## 2014-09-15 NOTE — Anesthesia Procedure Notes (Signed)
Anesthesia Regional Block:  Popliteal block  Pre-Anesthetic Checklist: ,, timeout performed, Correct Patient, Correct Site, Correct Laterality, Correct Procedure, Correct Position, site marked, Risks and benefits discussed,  Surgical consent,  Pre-op evaluation,  At surgeon's request and post-op pain management  Laterality: Lower and Left  Prep: chloraprep       Needles:  Injection technique: Single-shot  Needle Type: Echogenic Needle          Additional Needles:  Procedures: ultrasound guided (picture in chart) and nerve stimulator Popliteal block  Nerve Stimulator or Paresthesia:  Response: plantarflexion, 0.5 mA,   Additional Responses:   Narrative:  Injection made incrementally with aspirations every 5 mL.  Performed by: Personally  Anesthesiologist: Gailyn Crook  Additional Notes: H+P and labs reviewed, risks and benefits discussed with patient, procedure tolerated well without complications

## 2014-09-15 NOTE — Progress Notes (Signed)
Orthopedic Tech Progress Note Patient Details:  Scot Docksiah L Herda Jan 03, 1986 [id number]  Ortho Devices Ortho Device/Splint Location: applied ohf to bed  Ortho Device/Splint Interventions: Ordered;Application   Jennye MoccasinHughes, Kamali Nephew Craig 09/15/2014, 7:26 PM

## 2014-09-15 NOTE — Brief Op Note (Signed)
09/15/2014  2:54 PM  PATIENT:  [name]  28 y.o. male  PRE-OPERATIVE DIAGNOSIS:  Left Foot Crush Injury  POST-OPERATIVE DIAGNOSIS:  Left Foot Crush Injury  PROCEDURE:  Procedure(s): Left Foot Antibiotic Spacer Removal, 1st MTP Fusion, Left Iliac Crest Bone Graft (Left)  SURGEON:  Surgeon(s) and Role:    * Eldred MangesMark C Yates, MD - Primary  PHYSICIAN ASSISTANT: Maud DeedSheila Tenisha [facility]  ASSISTANTS: none   ANESTHESIA:   general  EBL:  Total I/O In: 1000 [I.V.:1000] Out: -   BLOOD ADMINISTERED:none  DRAINS: none   LOCAL MEDICATIONS USED:  MARCAINE  and OTHER EXparel in iliac crest incision  SPECIMEN:  No Specimen and Source of Specimen:  left metatarsal  DISPOSITION OF SPECIMEN:  micro  COUNTS:  YES  TOURNIQUET:   Total Tourniquet Time Documented: Thigh (Left) - 40 minutes Total: Thigh (Left) - 40 minutes   DICTATION: .Note written in EPIC  PLAN OF CARE: Admit for overnight observation  PATIENT DISPOSITION:  PACU - hemodynamically stable.   Delay start of Pharmacological VTE agent (>24hrs) due to surgical blood loss or risk of bleeding: yes

## 2014-09-15 NOTE — Discharge Instructions (Signed)
Elevate foot at rest.   NON WEIGHT BEARING ON LEFT FOOT Wear Darko shoe when out of bed. Keep dry and clean. Change dressing of the left hip daily or as needed. Ice packs to hip daily for pain and swelling.

## 2014-09-15 NOTE — Op Note (Signed)
[name], [name] NO.:  [id number]  MEDICAL RECORD NO.:  [id number]  LOCATION:  5N10C                        FACILITY:  [facility]  PHYSICIAN:  Yonah Tangeman C. Ophelia Charter, M.D.    DATE OF BIRTH:  [birth year]  DATE OF PROCEDURE:  09/15/2014 DATE OF DISCHARGE:                              OPERATIVE REPORT   PREOPERATIVE DIAGNOSIS:  Status post left foot crush injury, multiple procedures with retained polymethyl methacrylate and antibiotic spacer.  POSTOPERATIVE DIAGNOSIS:  Status post left foot crush injury, multiple procedures with retained polymethyl methacrylate and antibiotic spacer.  PROCEDURE:  Exploration and  removal antibiotic spacer, left first metatarsal.  Application of external fixator iliac crest bone graft with fusion of first  metatarsophalangeal joint and bone grafting to shaft.  SURGEON:  Kecia Swoboda C. Ophelia Charter, MD  ASSISTANT:  Maud Deed, PA-C, medically necessary and present for the entire procedure.  ESTIMATED BLOOD LOSS:  Minimal.  TOURNIQUET TIME:  One hour.  INDICATIONS:  This patient had multiple procedures crush injury, with extensive blistering dorsally over the skin, multiple fractures involving the fifth metatarsal, which was comminuted fractures of the proximal phalanx, dislocation of the IP joint of the great toe, dislocation of the metatarsophalangeal joint, and severely comminuted fifth metatarsal fracture with proximal metatarsal fracture transverse from the crush injury between a forklift and metal or cement object.  He has had multiple procedures and now with normal sed rate, CRP, white count is brought back for removal of antibiotic spacer and then grafting to the cavity for fusion of the MP joint of the great toe.  Extensive discussions have been held with the patient and his mother and other family members about possibility for nonhealing of this attempted grafting, potential for first ray amputation if this was not successful, and late  infection problems.  His sedimentation rate and CRP have all been entirely normal.  The patient had a proximal thigh tourniquet applied.  The iliac crest was then exposed.  Tender drapes were placed around it appropriately standard DuraPrep for the iliac crest, Betadine scrub and paint for the left lower extremity sheets and drapes.  Area was squared with towels around the iliac crest and Betadine and Steri-Drape applied.  Time-out procedure was completed.  The patient on culture had grown Pseudomonas and he was given gentamicin IV as well as standard Ancef prophylaxis. Incision was made opening the old scar, and there was a small amount of clear fluid around the antibiotic cement spacer.  This was cultured and then a small Synthes external fixator was applied, distracted the hold space.  The spacer bone plug was removed and capped for later measurement for appropriate length.  Capsule had typical synovial type fluid around it and was without any evidence of infection and looked ready for bone grafting.  Next, iliac crest was opened starting 2-3 cm posterior to the ASIS to avoid damage to the lateral femoral cutaneous nerve.  The outer cortex was taken taking about a third of the top of the lateral aspect of the crest retaining the medial 2/3rd and the pieces of cortex were removed as well as cancellous bone using gouges, cup curettes, angled curettes, and angle gouges.  Bone graft  was checked and compared to the antibiotic spacer.  Once enough bone graft was obtained, inspection showed there have been towards the posterior aspect a small breach of the inner table, this was not in line with the defect in the outer table and was more posterior and was smaller than a small fingertip.  A large piece of Gelfoam was placed after copious irrigation.  Standard closure with #1 and 0 Vicryl, 2-0 Vicryl, subcuticular closure, Dermabond, and Marcaine, and Exparel for postoperative analgesia.  The  patient had had a popliteal block for postoperative pain and bone was then packed with small pieces of cancellous into the defect with the external fixator appropriately positioned.  Bone was packed until no more bone would fit into the area, was checked under fluoroscopy in AP and lateral to make sure the position of the great toe was appropriate parallel to the other metatarsals and the great toe did not have a cock-up or cock-down position with either _excessive_________ dorsiflexion or plantar flexion.  A 2-0 nylon was used for skin closure.  One of the pins under fluoroscopy was little bit long, backed up after losing the external fixator slightly checking position again, good position and alignment.  The great toe still was slightly short as before.  It had not been shortened any more than had been previously, however, there was concern it was brought out to length that there could be some problems with vascularity of the toe, which had been discussed extensively with the patient.  The synovial- type capsule was closed up around with skin closure, around the bone graft.  Xeroform, 4x4s, Kerlix, and Ace wraps were applied for postoperative dressing.  The patient was transferred to the recovery room.  Instrument count and needle count was correct.     Myking Sar C. Ophelia CharterYates, M.D.     MCY/MEDQ  D:  09/15/2014  T:  09/15/2014  Job:  [id number]  cc:   [employer] workercompensation

## 2014-09-15 NOTE — Transfer of Care (Signed)
Immediate Anesthesia Transfer of Care Note  Patient: [name]  Procedure(s) Performed: Procedure(s): Left Foot Antibiotic Spacer Removal, 1st MTP Fusion, Left Iliac Crest Bone Graft (Left)  Patient Location: PACU  Anesthesia Type:GA combined with regional for post-op pain  Level of Consciousness: awake, alert , oriented and patient cooperative  Airway & Oxygen Therapy: Patient Spontanous Breathing  Post-op Assessment: Report given to PACU RN, Post -op Vital signs reviewed and stable and Patient moving all extremities  Post vital signs: Reviewed and stable  Complications: No apparent anesthesia complications

## 2014-09-15 NOTE — H&P (Signed)
[name] is an 28 y.o. male.   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  He has undergone the stated procedures as listed in [facility].  He has been followed since last procedure for clearing of infection.  Antibiotics utilized.   Seven weeks postop stopped his Levaquin for 1 week and repeated lab work, including CBC with diff, sedimentation rate and CRP. White count is 3600, sedimentation rate 1, CRP is less than 0.5, all normal labs.    He is now  ready for iliac crest bone grafting for fusion of the left 1st MTP joint and application of an external fixator. Discussed with him that with the bone grafting sometimes one end or potentially both ends might not heal.  He will be nonweightbearing after the procedure, and if both ends heal satisfactorily, then he will be able to begin progressive weightbearing and wean his way off his crutches.  Currently he is on his crutches.  He is partial weightbearing.  Past Medical History  Diagnosis Date  . Sickle cell trait   . Complication of anesthesia     LACRILUBE  REDNESS     Past Surgical History  Procedure Laterality Date  . I&d extremity Left 09/04/2013    Procedure: IRRIGATION AND DEBRIDEMENT EXTREMITY;  Surgeon: Eldred Manges, MD;  Location: [facility] OR;  Service: Orthopedics;  Laterality: Left;  . Percutaneous pinning Left 09/04/2013    Procedure: PERCUTANEOUS PINNING  LEFT FOOT;  Surgeon: Eldred Manges, MD;  Location: [facility] OR;  Service: Orthopedics;  Laterality: Left;  . I&d extremity Left 09/07/2013    Procedure: IRRIGATION AND DEBRIDEMENT EXTREMITY;  Surgeon: Eldred Manges, MD;  Location: [facility] OR;  Service: Orthopedics;  Laterality: Left;  . Orif toe fracture Left 09/07/2013    Procedure: OPEN REDUCTION INTERNAL FIXATION (ORIF)  GREAT TOE IP JOINT DISLOCATION, ORIF 1ST MTP JOINT DISLOCATION AND ORIF 1ST METATARSAL SHAFT FRACTURE.;  Surgeon:  Eldred Manges, MD;  Location: [facility] OR;  Service: Orthopedics;  Laterality: Left;  . Hammer toe surgery Left 01/06/2014    Procedure: Left Foot 1st MTP I&D with Bone Resection, and Antibiotic Cement;  Surgeon: Eldred Manges, MD;  Location: [facility] OR;  Service: Orthopedics;  Laterality: Left;  . I&d extremity Left 07/12/2014    Procedure: IRRIGATION AND DEBRIDEMENT EXTREMITY;  Surgeon: Eldred Manges, MD;  Location: [facility] OR;  Service: Orthopedics;  Laterality: Left;  Left Foot 1st Metatarsal Removal Antibiotic Cement Spacers, Bone Debridement, and Placement of Longer Antibiotic Cement Spacer    History reviewed. No pertinent family history. Social History:  reports that he quit smoking about 3 months ago. He has never used smokeless tobacco. He reports that he does not drink alcohol or use illicit drugs.  Allergies:  Allergies  Allergen Reactions  . Other     "lacrilube" Caused redness     No prescriptions prior to admission    No results found for this or any previous visit (from the past 48 hour(s)). No results found.  Review of Systems  Musculoskeletal:       Left foot pain  All other systems reviewed and are negative.   There were no vitals taken for this visit. Physical Exam  Constitutional: He is oriented to person, place, and time. He appears well-developed and well-nourished.  HENT:  Head: Normocephalic and atraumatic.  Eyes: EOM are normal.  Pupils are equal, round, and reactive to light.  Neck: Normal range of motion.  Cardiovascular: Normal rate.   Respiratory: Effort normal.  GI: Soft.  Musculoskeletal:  No wound drainage of left foot.  Incision healed well. Mild edema.  Neurological: He is alert and oriented to person, place, and time.  Skin: Skin is warm and dry.  Psychiatric: He has a normal mood and affect.     Assessment/Plan Crush injury of left foot  Removal of antibiotic spacer left foot..  Left first MTP fusion with iliac crest bone graft.  Santasia [name]  M 09/15/2014, 9:36 AM

## 2014-09-15 NOTE — Anesthesia Preprocedure Evaluation (Signed)
Anesthesia Evaluation  Patient identified by MRN, date of birth, ID band Patient awake    Reviewed: Allergy & Precautions  History of Anesthesia Complications Negative for: history of anesthetic complications  Airway Mallampati: I TM Distance: >3 FB Neck ROM: Full    Dental  (+) Teeth Intact   Pulmonary neg shortness of breath, neg COPDneg recent URI, former smoker,  breath sounds clear to auscultation        Cardiovascular negative cardio ROS  Rhythm:Regular     Neuro/Psych negative neurological ROS     GI/Hepatic negative GI ROS, Neg liver ROS,   Endo/Other  negative endocrine ROS  Renal/GU negative Renal ROS     Musculoskeletal   Abdominal   Peds  Hematology negative hematology ROS (+)   Anesthesia Other Findings   Reproductive/Obstetrics                           Anesthesia Physical Anesthesia Plan  ASA: II  Anesthesia Plan: General and Regional   Post-op Pain Management:    Induction: Intravenous  Airway Management Planned: Oral ETT and LMA  Additional Equipment: None  Intra-op Plan:   Post-operative Plan: Extubation in OR  Informed Consent: I have reviewed the patients History and Physical, chart, labs and discussed the procedure including the risks, benefits and alternatives for the proposed anesthesia with the patient or authorized representative who has indicated his/her understanding and acceptance.   Dental advisory given  Plan Discussed with: CRNA and Surgeon  Anesthesia Plan Comments:         Anesthesia Quick Evaluation

## 2014-09-15 NOTE — Interval H&P Note (Signed)
History and Physical Interval Note:  09/15/2014 12:28 PM  [name] [place]  has presented today for surgery, with the diagnosis of Left Foot Crush Injury  The various methods of treatment have been discussed with the patient and family. After consideration of risks, benefits and other options for treatment, the patient has consented to  Procedure(s): Left Foot Antibiotic Spacer Removal, 1st MTP Fusion, Left Iliac Crest Bone Graft (Left) as a surgical intervention .  The patient's history has been reviewed, patient examined, no change in status, stable for surgery.  I have reviewed the patient's chart and labs.  Questions were answered to the patient's satisfaction.     Huber Mathers C

## 2014-09-16 MED ORDER — HYDROMORPHONE HCL 1 MG/ML IJ SOLN
0.5000 mg | INTRAMUSCULAR | Status: DC | PRN
  Administered 2014-09-16 – 2014-09-17 (×11): 0.5 mg via INTRAVENOUS
  Administered 2014-09-17: 23:00:00 via INTRAVENOUS
  Administered 2014-09-18 (×6): 0.5 mg via INTRAVENOUS
  Filled 2014-09-16 (×19): qty 1

## 2014-09-16 MED ORDER — POLYVINYL ALCOHOL 1.4 % OP SOLN
2.0000 [drp] | OPHTHALMIC | Status: DC | PRN
  Administered 2014-09-16: 2 [drp] via OPHTHALMIC
  Filled 2014-09-16: qty 15

## 2014-09-16 NOTE — Progress Notes (Signed)
UR completed 

## 2014-09-16 NOTE — Evaluation (Signed)
Physical Therapy Evaluation Patient Details Name: [name] MRN: [id number] DOB: [birth year] Today's Date: 09/16/2014   History of Present Illness  28 y.o. male admitted to [facility] on 09/15/14 s/p L foot antibiotic spacer removal, external fixatior illiac crest bone graft with fusion of metatarsophalangeal joint (and bone graft to shaft). 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    Clinical Impression  Pt is limited by pain an lethargy during our session today. He had a hard time keeping his eyes open (not sure if it was pain med related or lightheadedness or both).  He took an extensive amount of time to get 8' with the axillary crutches and was unable to attempt stair training today.  I anticipate he will progress well given his age, but based on today's performance I am recommending HHPT f/u and a RW at discharge.   PT to follow acutely for deficits listed below.       Follow Up Recommendations Home health PT (in current condition, this is warrented)    Equipment Recommendations  Rolling walker with 5" wheels    Recommendations for Other Services   OT consult    Precautions / Restrictions Precautions Precautions: Fall Precaution Comments: due to lightheadedness, unsteadiness on the crutches and NWB status of the L foot.  Required Braces or Orthoses: Other Brace/Splint Other Brace/Splint: DARCO shoe in comments of MD orders, but pt left his DARCO shoe at home Restrictions LLE Weight Bearing: Non weight bearing      Mobility  Bed Mobility Overal bed mobility: Modified Independent             General bed mobility comments: HOB elevated and pt using railing for leverage  Transfers Overall transfer level: Needs assistance Equipment used: Crutches Transfers: Sit to/from Stand Sit to Stand: Min assist         General transfer comment: Min assist to steady pt during transitions. Verbal cues for NWB status of his  left leg and foot.   Ambulation/Gait Ambulation/Gait assistance: Min assist Ambulation Distance (Feet): 8 Feet Assistive device: Crutches Gait Pattern/deviations: Step-to pattern;Trunk flexed (Hop- to pattern) Gait velocity: significantly decreased Gait velocity interpretation: <1.8 ft/sec, indicative of risk for recurrent falls General Gait Details: Pt took an excessive amount of time to preform a dozen or so hop steps with axillary crutches to the recliner chair.  He was limited by pain, and lethargy (max verbal cues to keep his eyes open).  Pt reporting lightheadedness and PT and PT tech pulled chair around for him to sit.  I explained to pt and his mom that he may feel more stable with RW use for a few days before transferring over to the crutches.  Further assessment needs to be made.   Stairs Stairs:  (unable to attempt today)                 Balance Overall balance assessment: Needs assistance Sitting-balance support: Feet supported;No upper extremity supported Sitting balance-Leahy Scale: Good     Standing balance support: Bilateral upper extremity supported Standing balance-Leahy Scale: Poor Standing balance comment: needs external assist to stand.                              Pertinent Vitals/Pain Pain Assessment: 0-10 Pain Score: 8  Pain Location: foot, left and anterior hip/left lower quadrant (site of bone graft) left Pain Descriptors / Indicators: Aching;Burning  Pain Intervention(s): Limited activity within patient's tolerance;Monitored during session;Premedicated before session;Repositioned    Home Living Family/patient expects to be discharged to:: Private residence Living Arrangements: Parent (mother)   Type of Home: Apartment Home Access: Stairs to enter Entrance Stairs-Rails: Right;Left;Can reach both Secretary/administratorntrance Stairs-Number of Steps: 10-11 Home Layout: One level Home Equipment: Crutches;Shower seat      Prior Function Level of  Independence: Independent with assistive device(s)         Comments: was using crutches PTA and PWB     Hand Dominance   Dominant Hand: Right    Extremity/Trunk Assessment   Upper Extremity Assessment: Overall WFL for tasks assessed           Lower Extremity Assessment: LLE deficits/detail   LLE Deficits / Details: left leg strength limited by pain.  Pt able to wiggle his toes, limited DF/PF due to pain, extend his knee 3/5, and flex his hip 3-/5.    Cervical / Trunk Assessment: Normal  Communication   Communication: No difficulties  Cognition Arousal/Alertness: Lethargic Behavior During Therapy: WFL for tasks assessed/performed Overall Cognitive Status: Within Functional Limits for tasks assessed                               Assessment/Plan    PT Assessment Patient needs continued PT services  PT Diagnosis Difficulty walking;Abnormality of gait;Generalized weakness;Acute pain   PT Problem List Decreased strength;Decreased range of motion;Decreased activity tolerance;Decreased balance;Decreased mobility;Decreased knowledge of use of DME;Decreased knowledge of precautions;Pain  PT Treatment Interventions DME instruction;Gait training;Stair training;Functional mobility training;Therapeutic activities;Therapeutic exercise;Balance training;Neuromuscular re-education;Patient/family education;Modalities   PT Goals (Current goals can be found in the Care Plan section) Acute Rehab PT Goals Patient Stated Goal: to decrease pain PT Goal Formulation: With patient/family Time For Goal Achievement: 09/23/14 Potential to Achieve Goals: Good    Frequency Min 5X/week   Barriers to discharge Inaccessible home environment has a flight of stairs to enter his home       End of Session   Activity Tolerance: Patient limited by pain;Patient limited by lethargy Patient left: in chair;with call bell/phone within reach;with family/visitor present Nurse Communication:  Mobility status Manufacturing engineer(RN tech)    Functional Assessment Tool Used: assist level Functional Limitation: Mobility: Walking and moving around Mobility: Walking and Moving Around Current Status [phone]): At least 20 percent but less than 40 percent impaired, limited or restricted Mobility: Walking and Moving Around Goal Status [phone]): At least 1 percent but less than 20 percent impaired, limited or restricted    Time: 1040-1120 PT Time Calculation (min): 40 min   Charges:   PT Evaluation $Initial PT Evaluation Tier I: 1 Procedure PT Treatments $Gait Training: 23-37 mins   PT G Codes:   Functional Assessment Tool Used: assist level Functional Limitation: Mobility: Walking and moving around    Mount SidneyRebecca B. Khristian Seals, PT, DPT [phone]   09/16/2014, 3:03 PM

## 2014-09-16 NOTE — Progress Notes (Signed)
Subjective: 1 Day Post-Op Procedure(s) (LRB): Left Foot Antibiotic Spacer Removal, 1st MTP Fusion, Left Iliac Crest Bone Graft (Left) Patient reports pain as severe.    Objective: Vital signs in last 24 hours: Temp:  [97.8 F (36.6 [name])-100 F (37.8 [name])] 100 F (37.8 [name]) (10/08 0541) Pulse Rate:  [65-98] 91 (10/08 0541) Resp:  [10-20] 10 (10/07 1600) BP: (102-136)/(56-88) 104/59 mmHg (10/08 0541) SpO2:  [93 %-100 %] 93 % (10/08 0541) Weight:  [77.111 kg (170 lb)] 77.111 kg (170 lb) (10/07 1035)  Intake/Output from previous day: 10/07 0701 - 10/08 0700 In: 1920 [P.O.:120; I.V.:1750; IV Piggyback:50] Out: 1900 [Urine:1900] Intake/Output this shift:     Recent Labs  09/15/14 1034  HGB 15.0    Recent Labs  09/15/14 1034  WBC 4.5  RBC 5.40  HCT 44.2  PLT 189    Recent Labs  09/15/14 1034  NA 143  K 3.7  CL 104  CO2 25  BUN 12  CREATININE 1.06  GLUCOSE 108*  CALCIUM 9.2    Recent Labs  09/15/14 1034  INR 0.98    Neurologically intact  Assessment/Plan: 1 Day Post-Op Procedure(s) (LRB): Left Foot Antibiotic Spacer Removal, 1st MTP Fusion, Left Iliac Crest Bone Graft (Left) Up with therapy  He needs to get up with therapy, is NWB  [name] 09/16/2014, 7:10 AM

## 2014-09-17 LAB — WOUND CULTURE: CULTURE: NO GROWTH

## 2014-09-17 NOTE — Progress Notes (Signed)
Physical Therapy Treatment Patient Details Name: Scot Docksiah L Huckeby MRN: [id number] DOB: 27-Mar-1986 Today's Date: 09/17/2014    History of Present Illness 28 y.o. male [facility] s/p L foot antibiotic spacer removal, external fixatior illiac crest bone graft with fusion of metatarsophalangeal joint (and bone graft to shaft). 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      PT Comments    Patient was willing to ambulate and practice steps however once in standing, rehab tech noticed blood dripping from patients wound. Patient was returned to supine with leg elevated and RN and charge nurse made aware.     Follow Up Recommendations  Home health PT     Equipment Recommendations  Rolling walker with 5" wheels    Recommendations for Other Services       Precautions / Restrictions Precautions Precautions: Fall Required Braces or Orthoses: Other Brace/Splint Other Brace/Splint: DARCO shoe in comments of MD orders, but pt left his DARCO shoe at home Restrictions LLE Weight Bearing: Non weight bearing    Mobility  Bed Mobility Overal bed mobility: Needs Assistance Bed Mobility: Sit to Supine       Sit to supine: Min assist   General bed mobility comments: Min A for LEs back into bed  Transfers Overall transfer level: Needs assistance Equipment used: Rolling walker (2 wheeled) Transfers: Sit to/from Stand Sit to Stand: Min guard         General transfer comment: Patient able to stand MinGuard A and take two hops however once standing noted blood dripping from R foot wound  Ambulation/Gait Ambulation/Gait assistance: Min guard Ambulation Distance (Feet): 2 Feet Assistive device: Rolling walker (2 wheeled) Gait Pattern/deviations: Step-to pattern     General Gait Details: see overall comments.    Stairs            Wheelchair Mobility    Modified Rankin (Stroke Patients Only)        Balance                                    Cognition Arousal/Alertness: Awake/alert Behavior During Therapy: WFL for tasks assessed/performed Overall Cognitive Status: Within Functional Limits for tasks assessed                      Exercises      General Comments        Pertinent Vitals/Pain Pain Assessment: 0-10 Pain Score: 8  Pain Location: L foot Pain Descriptors / Indicators: Aching;Burning Pain Intervention(s): Limited activity within patient's tolerance;Monitored during session;Premedicated before session    Home Living                      Prior Function            PT Goals (current goals can now be found in the care plan section) Progress towards PT goals: Progressing toward goals    Frequency  Min 5X/week    PT Plan Current plan remains appropriate    Co-evaluation             End of Session   Activity Tolerance: Other (comment) (blood dripping from wound) Patient left: in chair;with call bell/phone within reach;with family/visitor present     Time: 1005-1018 PT Time Calculation (min): 13 min  Charges:  $Therapeutic Activity: 8-22 mins  G Codes:      Fredrich BirksRobinette, Fitzhugh Vizcarrondo Elizabeth 09/17/2014, 12:04 PM  09/17/2014 Fredrich Birksobinette, Carlena Ruybal Elizabeth PTA [phone] pager [phone] office

## 2014-09-17 NOTE — Progress Notes (Signed)
Subjective: 2 Days Post-Op Procedure(s) (LRB): Left Foot Antibiotic Spacer Removal, 1st MTP Fusion, Left Iliac Crest Bone Graft (Left) Patient reports pain as severe.   Pt has not walked to BR yet.  Has all equipment needs at home.   May be discharge later today if improved ambulation and pain control.  If not then tomorrow am  Objective: Vital signs in last 24 hours: Temp:  [99.1 F (37.3 C)-99.6 F (37.6 C)] 99.1 F (37.3 C) (10/09 0554) Pulse Rate:  [86-97] 86 (10/09 0554) Resp:  [16-19] 19 (10/09 0554) BP: (106-113)/(64-68) 112/65 mmHg (10/09 0554) SpO2:  [96 %-98 %] 96 % (10/09 0554)  Intake/Output from previous day: 10/08 0701 - 10/09 0700 In: 1260 [P.O.:1260] Out: 800 [Urine:800] Intake/Output this shift:     Recent Labs  09/15/14 1034  HGB 15.0    Recent Labs  09/15/14 1034  WBC 4.5  RBC 5.40  HCT 44.2  PLT 189    Recent Labs  09/15/14 1034  NA 143  K 3.7  CL 104  CO2 25  BUN 12  CREATININE 1.06  GLUCOSE 108*  CALCIUM 9.2    Recent Labs  09/15/14 1034  INR 0.98    Neurologically intact Incision: hip wound dry and clean.  dressing to foot intact   Assessment/Plan: 2 Days Post-Op Procedure(s) (LRB): Left Foot Antibiotic Spacer Removal, 1st MTP Fusion, Left Iliac Crest Bone Graft (Left) Up with therapy Plan for discharge tomorrow if doesn't do well enough today to go home.  Needs to be able to get to BR at home.  Has equipment as he has had surgery several times since injury in Sept 2014.   DC IV and use po analgesics today rx on chart for discharge  Laverda Stribling M 09/17/2014, 10:11 AM

## 2014-09-17 NOTE — Progress Notes (Signed)
Agree with PTA.    Angelique Chevalier, PT [phone]  

## 2014-09-17 NOTE — Care Management Note (Signed)
CARE MANAGEMENT NOTE 09/17/2014  Patient:  [name],[name]   Account Number:  [id number]  Date Initiated:  09/17/2014  Documentation initiated by:  Vance PeperBRADY,Omer Monter  Subjective/Objective Assessment:   28 yr old male admitted with hx of left foot crush injury. Patient underwent exploration and removal of antibiotic spacer, Left first metatarsal , application of external fixator and bone grafting.     Action/Plan:   Per Dr. Ophelia CharterYates patient has no home health needs, has all necessary DME, has had multiple procedures in the past  for this procedure. Patient lives with his mother.   Anticipated DC Date:  09/17/2014   Anticipated DC Plan:  HOME/SELF CARE      DC Planning Services  CM consult      PAC Choice  NA   Choice offered to / List presented to:     DME arranged  NA        HH arranged  NA      Status of service:  Completed, signed off Medicare Important Message given?   (If response is "NO", the following Medicare IM given date fields will be blank) Date Medicare IM given:   Medicare IM given by:   Date Additional Medicare IM given:   Additional Medicare IM given by:    Discharge Disposition:  HOME/SELF CARE  Per UR Regulation:  Reviewed for med. necessity/level of care/duration of stay  If discussed at Long Length of Stay Meetings, dates discussed:    Comments:  09/17/14 11:23am Vance PeperSusan Drexel Ivey, RN BSN Case Manager Patient's workers comp adj; Albionarolyn Fix [phone]  Fax [phone]

## 2014-09-17 NOTE — Progress Notes (Addendum)
Physical Therapy Treatment Patient Details Name: [name] Hiott MRN: [id number] DOB: 04/13/1986 Today's Date: 09/17/2014    History of Present Illness 28 y.o. [facility] s/p [name] foot antibiotic spacer removal, external fixatior illiac crest bone graft with fusion of metatarsophalangeal joint (and bone graft to shaft). Pt with significant PMHx of multiple surgical proceedures of the [name] foot (4), and sickle cell trait.      PT Comments    Patient attempting to try steps this afternoon but was unable to hop back one step. Mother in room at end of session and stated that she had assisting him up the steps 6 times before and she knows that her and her daughter can do it again. Will attempt again in AM. Patient is very determined but very limited by donor site pain.   Follow Up Recommendations  No PT follow up     Equipment Recommendations  Rolling walker with 5" wheels    Recommendations for Other Services       Precautions / Restrictions Precautions Precautions: Fall Required Braces or Orthoses: Other Brace/Splint Other Brace/Splint: DARCO shoe in comments of MD orders, but pt left his DARCO shoe at home Restrictions LLE Weight Bearing: Non weight bearing    Mobility  Bed Mobility Overal bed mobility: Needs Assistance Bed Mobility: Sit to Supine       Sit to supine: Supervision   General bed mobility comments: Patient able to complete with increased time and no assistance needed  Transfers Overall transfer level: Needs assistance Equipment used: Rolling walker (2 wheeled) Transfers: Sit to/from Stand Sit to Stand: Min guard         General transfer comment: Stood x2 with cues for safe technique. Required increased time but no physical assistance  Ambulation/Gait Ambulation/Gait assistance: Min guard Ambulation Distance (Feet): 20 Feet Assistive device: Rolling walker (2 wheeled) Gait Pattern/deviations: Step-to pattern Gait velocity:  significantly decreased Gait velocity interpretation: <1.8 ft/sec, indicative of risk for recurrent falls General Gait Details: Cues for RW management. Much safer with RW but increased time required   Stairs Stairs: Yes Stairs assistance: Min assist Stair Management: Step to pattern;Backwards;With walker;No rails Number of Stairs: 1 General stair comments: Attempted one step backwards however patientunable to complete x3 attempts. May attempt again tomorrow and he may have to scoot up on his bottom.   Wheelchair Mobility    Modified Rankin (Stroke Patients Only)       Balance                                    Cognition Arousal/Alertness: Awake/alert Behavior During Therapy: WFL for tasks assessed/performed Overall Cognitive Status: Within Functional Limits for tasks assessed                      Exercises      General Comments        Pertinent Vitals/Pain Pain Assessment: 0-10 Pain Score: 8  Pain Location: [name] side (donor site) Pain Descriptors / Indicators: Burning Pain Intervention(s): Monitored during session;Limited activity within patient's tolerance    Home Living                      Prior Function            PT Goals (current goals can now be found in the care plan section) Progress towards PT goals: Progressing toward  goals    Frequency  Min 5X/week    PT Plan Discharge plan needs to be updated    Co-evaluation             End of Session Equipment Utilized During Treatment: Gait belt Activity Tolerance: Patient limited by pain Patient left: in chair;with call bell/phone within reach;with family/visitor present     Time: 1330-1410 PT Time Calculation (min): 40 min  Charges:  $Gait Training: 23-37 mins $Therapeutic Activity: 8-22 mins                    G Codes:      Fredrich BirksRobinette, Julia Elizabeth 09/17/2014, 2:41 PM  09/17/2014 Fredrich Birksobinette, Julia Elizabeth PTA [phone] pager [phone] office

## 2014-09-18 NOTE — Care Management Note (Signed)
    Page 1 of 2   09/18/2014     12:40:37 PM CARE MANAGEMENT NOTE 09/18/2014  Patient:  [name],[name]   Account Number:  [id number]  Date Initiated:  09/17/2014  Documentation initiated by:  [name]  Subjective/Objective Assessment:   28 yr old male admitted with hx of left foot crush injury. Patient underwent exploration and removal of antibiotic spacer, Left first metatarsal , application of external fixator and bone grafting.     Action/Plan:   Per Dr. Ophelia [name] patient has no home health needs, has all necessary DME, has had multiple procedures in the past  for this procedure. Patient lives with [name].   Anticipated DC Date:  09/17/2014   Anticipated DC Plan:  HOME/SELF CARE      DC Planning Services  CM consult      PAC Choice  NA   Choice offered to / List presented to:     DME arranged  NA        HH arranged  NA      Status of service:  Completed, signed off Medicare Important Message given?   (If response is "NO", the following Medicare IM given date fields will be blank) Date Medicare IM given:   Medicare IM given by:   Date Additional Medicare IM given:   Additional Medicare IM given by:    Discharge Disposition:  HOME/SELF CARE  Per UR Regulation:  Reviewed for med. necessity/level of care/duration of stay  If discussed at Long Length of Stay Meetings, dates discussed:    Comments:  09/17/14 11:23am Mike PeperSusan Brady, RN BSN Case Manager Patient's workers comp adj; Mike SirenCarolyn [name] [phone]  Fax [phone] 09/18/14 11:15 C received call from RN stating pt needed a ride home provided by [employer].  CM reviewed PT recc and notes pt has no follow up recc. but does not  have a rolling walker DME order; CM requests DME RW order from RN. [name]'s nuber called several times but only get a busy signal; Workers comp main number called and no personnel on the weekends; CM spoke with pt to see if he has any other contact number and he only has a  transportation number [phone] which was also called several times with no answer.  CM called [facility] DME rep, who states he will have pt sign for walker and pt can passpaperwork onto  with Workers Comp agent. CM called CSW, [name] to arrange for transportation home for pt. Order, PT EVAL and Facesheet faxed to Mike Sirenarolyn [name] at [phone] with note [facility] bc WC failed to answer any contact numbers given and failed to give pt any information for DME or HH arrangements if discharged on the weekend.    No other CM needs were communicated.  [name], BSN, CM [phone].

## 2014-09-18 NOTE — Progress Notes (Signed)
Physical Therapy Treatment Patient Details Name: [name] MRN: [id number] DOB: [birth year] Today's Date: 09/18/2014    History of Present Illness 28 y.o. male [facility] s/p L foot antibiotic spacer removal, external fixatior illiac crest bone graft with fusion of metatarsophalangeal joint (and bone graft to shaft). 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      PT Comments    Patient progressing this session. Able to practice butt bumping up the steps and able to show mom through video. Patient is planning to DC home today and family had no further questions at this time.   Follow Up Recommendations  No PT follow up     Equipment Recommendations  Rolling walker with 5" wheels    Recommendations for Other Services       Precautions / Restrictions Precautions Precautions: Fall Required Braces or Orthoses: Other Brace/Splint Other Brace/Splint: DARCO shoe in comments of MD orders, but pt left his DARCO shoe at home Restrictions LLE Weight Bearing: Non weight bearing    Mobility  Bed Mobility Overal bed mobility: Modified Independent                Transfers Overall transfer level: Needs assistance Equipment used: Rolling walker (2 wheeled) Transfers: Sit to/from Stand Sit to Stand: Supervision         General transfer comment: Patient with safe technique. Supervision for safety  Ambulation/Gait Ambulation/Gait assistance: Min guard Ambulation Distance (Feet): 10 Feet Assistive device: Rolling walker (2 wheeled) Gait Pattern/deviations: Step-to pattern     General Gait Details: Cues for RW management. Limited by fatique   Stairs Stairs: Yes Stairs assistance: Min assist Stair Management: Backwards;No rails Number of Stairs: 6 General stair comments: Patient able to butt bump up 6 steps with A to elevate from behind and A to hold LLE coming down. Cues for  technique  Wheelchair Mobility    Modified Rankin (Stroke Patients Only)       Balance                                    Cognition Arousal/Alertness: Awake/alert Behavior During Therapy: WFL for tasks assessed/performed Overall Cognitive Status: Within Functional Limits for tasks assessed                      Exercises      General Comments        Pertinent Vitals/Pain Pain Score: 8  Pain Location: L side (donor side) Pain Descriptors / Indicators: Burning Pain Intervention(s): Monitored during session    Home Living                      Prior Function            PT Goals (current goals can now be found in the care plan section) Progress towards PT goals: Progressing toward goals    Frequency  Min 5X/week    PT Plan Current plan remains appropriate    Co-evaluation             End of Session Equipment Utilized During Treatment: Gait belt Activity Tolerance: Patient tolerated treatment well Patient left: in chair;with call bell/phone within reach;with nursing/sitter in room     Time: 0930-1000 PT Time Calculation (min): 30 min  Charges:  $Gait Training: 8-22 mins $Therapeutic Activity: 8-22  mins                    G Codes:      Fredrich BirksRobinette, Particia Strahm Elizabeth 09/18/2014, 12:03 PM 09/18/2014 Fredrich Birksobinette, Donda Friedli Elizabeth PTA [phone] pager [phone] office

## 2014-09-18 NOTE — Progress Notes (Signed)
Pt will need assistance with transportation. CSW confirmed pt address is [address]  CSW provided pt nurse with taxi voucher. Pt has no further hospital social work needs.  Niah Heinle, LCSWA [phone]

## 2014-09-18 NOTE — Progress Notes (Signed)
Utilization Review completed.  

## 2014-09-25 ENCOUNTER — Encounter (HOSPITAL_COMMUNITY): Payer: Self-pay | Admitting: Orthopaedic Surgery

## 2014-10-20 ENCOUNTER — Emergency Department (HOSPITAL_COMMUNITY)
Admission: EM | Admit: 2014-10-20 | Discharge: 2014-10-20 | Disposition: A | Discharge status: Home / Self Care | Payer: Worker's Compensation | Attending: Emergency Medicine | Admitting: Emergency Medicine

## 2014-10-20 ENCOUNTER — Encounter (HOSPITAL_COMMUNITY): Payer: Self-pay | Admitting: *Deleted

## 2014-10-20 DIAGNOSIS — Z862 Personal history of diseases of the blood and blood-forming organs and certain disorders involving the immune mechanism: Secondary | ICD-10-CM | POA: Insufficient documentation

## 2014-10-20 DIAGNOSIS — G8929 Other chronic pain: Secondary | ICD-10-CM | POA: Insufficient documentation

## 2014-10-20 DIAGNOSIS — M79672 Pain in left foot: Secondary | ICD-10-CM | POA: Diagnosis present

## 2014-10-20 MED ORDER — NAPROXEN 500 MG PO TABS: 500.0000 mg | ORAL_TABLET | Freq: Two times a day (BID) | ORAL | Status: DC

## 2014-10-20 MED ORDER — OXYCODONE-ACETAMINOPHEN 5-325 MG PO TABS
2.0000 | ORAL_TABLET | Freq: Once | ORAL | Status: AC
Start: 2014-10-20 — End: 2014-10-20
  Administered 2014-10-20: 2 via ORAL
  Filled 2014-10-20: qty 2

## 2014-10-20 MED ORDER — HYDROCODONE-ACETAMINOPHEN 5-325 MG PO TABS: 2.0000 | ORAL_TABLET | ORAL | Status: DC | PRN

## 2014-10-20 NOTE — ED Notes (Signed)
Reports hx of foot injury and had recent surgery in oct. Now having increase in pain and swelling, is out of pain meds. Reports possible fevers recently. No acute distress noted at triage.

## 2014-10-20 NOTE — Discharge Instructions (Signed)
°Emergency Department Resource Guide °1) Find a Doctor and Pay Out of Pocket °Although you won't have to find out who is covered by your insurance plan, it is a good idea to ask around and get recommendations. You will then need to call the office and see if the doctor you have chosen will accept you as a new patient and what types of options they offer for patients who are self-pay. Some doctors offer discounts or will set up payment plans for their patients who do not have insurance, but you will need to ask so you aren't surprised when you get to your appointment. ° °2) Contact Your Local Health Department °Not all health departments have doctors that can see patients for sick visits, but many do, so it is worth a call to see if yours does. If you don't know where your local health department is, you can check in your phone book. The CDC also has a tool to help you locate your state's health department, and many state websites also have listings of all of their local health departments. ° °3) Find a Walk-in Clinic °If your illness is not likely to be very severe or complicated, you may want to try a walk in clinic. These are popping up all over the country in pharmacies, drugstores, and shopping centers. They're usually staffed by nurse practitioners or physician assistants that have been trained to treat common illnesses and complaints. They're usually fairly quick and inexpensive. However, if you have serious medical issues or chronic medical problems, these are probably not your best option. ° °No Primary Care Doctor: °- Call [facility] Connect at  832-8000 - they can help you locate a primary care doctor that  accepts your insurance, provides certain services, etc. °- Physician Referral Service- 1-800-533-3463 ° °Chronic Pain Problems: °Organization         Address  Phone   Notes  °[facility]  (336) 297-2271 Patients need to be referred by their primary care doctor.  ° °Medication  Assistance: °Organization         Address  Phone   Notes  °[place] [facility] [address] °[place], [place] [address] (336) 641-8030 --Must be a resident of [place] °-- Must have NO insurance coverage whatsoever (no Medicaid/ Medicare, etc.) °-- The pt. MUST have a primary care doctor that directs their care regularly and follows them in the community °  °[facility]  (866) 331-1348   °[employer] Way  (888) 892-1162   ° °Agencies that provide inexpensive medical care: °Organization         Address  Phone   Notes  °[facility]  (336) 832-8035   ° Internal Medicine    (336) 832-7272   °[facility] [address] °[place], [place] [address] (336) 832-4777   °[facility] [address], °[place] (336) 271-4999   °[facility]    (336) 373-0678   °[facility]    (336) 272-1050   °[facility] ° [address],  Phone:  (336) 832-4444, Fax:  (336) 832-4440 Hours of Operation:  9 am - 6 pm, M-F.  Also accepts Medicaid/Medicare and self-pay.  °[facility] ° [address],  Phone: (336) 832-3150, Fax: (336) 832-3151. Hours of Operation:  8:30 am - 5:30 pm, M-F.  Also accepts Medicaid and self-pay.  °[facility] [place] [address], [place] Phone: (336) 878-6027   °[facility] [address], [place], [place] (336)723-1848, Ext. 123 Mondays & Thursdays: 7-9 AM.  First 15 patients are seen on a first come, first serve basis. °  ° °Medicaid-accepting [place] Providers: ° °Organization         Address  Phone   Notes  °[facility] [address], [place] (336) 641-2100 Also accepts self-pay patients.  °[facility] [address], [place] ° (336) 856-9996   °[facility] [address], [place]  (336) 288-8857   °[facility] [address] [place] [address], [place] (336) 299-7000   °Veita Bland [address], [place]  ° (336) 373-1557 Only accepts [place] Access Medicaid patients after they have their name applied to their card.  ° °Self-Pay (no insurance) in [place]: ° °Organization         Address  Phone   Notes  °Sickle Cell Patients, [facility] [address], [place] (336) 832-1970   °[facility] [address], [place] (336) 832-4400   °[facility] ° [address], [place] (336) 992-4800   °[facility]/Dr. Osei-Bonsu ° [address] [place] [address], [place] or [address], [place] (336) 841-8500 Phone number for both [place] and [place] locations is the same.  °[facility] [address], [place] (336) 299-0000   °[facility] [address] [place] [address], [place] or [address] (336) 852-7530 °(336) 878-2260   °[facility] [address], [place] (336) 350-1642, phone; (336) 294-5005, fax Sees patients 1st and 3rd Saturday of every month.  Must not qualify for public or private insurance (i.e. Medicaid, Medicare, [place] Health Choice, Veterans' Benefits) • Household income should be no more than 200% of the poverty level •The clinic cannot treat you if you are pregnant or think you are pregnant • Sexually transmitted diseases are not treated at the clinic.  ° ° °Dental Care: °Organization         Address  Phone  Notes  °[place] [facility] [address], [place] (336) 641-6152 Accepts children up to age 21 who are enrolled in Medicaid or [place] Health Choice; pregnant women with a Medicaid card; and children who have applied for Medicaid or [place] Health Choice, but were declined, whose parents can pay a reduced fee at time of service.  °[place]  [facility] [place]  [address], [place] (336) 641-7733 Accepts children up to age 21 who are enrolled in Medicaid or [place] Health Choice; pregnant women with a Medicaid card; and children who have applied for Medicaid or [place] Health Choice, but were declined, whose parents can pay a reduced fee at time of service.  °[facility] ° [address], [place] (336) 641-4533 Patients are seen by appointment only. Walk-ins are not accepted. [facility] will see patients 18 years of age and older. °Monday - Tuesday (8am-5pm) °Most Wednesdays (8:30-5pm) °$30 per visit, cash only  °[facility] ° [address], [place] (336) 641-4533 Patients are seen by appointment only. Walk-ins are not accepted. [facility] will see patients 18 years of age and older. °One   Wednesday Evening (Monthly: Volunteer Based).  $30 per visit, cash only  °[facility]  (919) 537-3737 for adults; Children under age 4, call Graduate Pediatric Dentistry at (919) [phone]. Children aged 4-14, please call (919) [phone] to request a pediatric application. ° Dental services are provided in all areas of dental care including fillings, crowns and bridges, complete and partial dentures, implants, gum treatment, root canals, and extractions. Preventive care is also provided. Treatment is provided to both adults and children. °Patients are selected via a lottery and there is often a waiting list. °  °[facility] [address], °[place] ° (336) 763-8833 [email or web address] °  °[facility] [address], [place], [place] (336)723-1848, Ext. 123 Second and Fourth Thursday of each month, opens at 6:30 AM; Clinic ends at 9 AM.  Patients are seen on a first-come first-served basis, and a limited number are seen during each clinic.  ° °[facility] ° [address], [place], [place] (336) 723-7904    Eligibility Requirements °You must have lived in [place], [place], or [place] counties for at least the last three months. °  You cannot be eligible for state or federal sponsored healthcare insurance, including Veterans Administration, Medicaid, or Medicare. °  You generally cannot be eligible for healthcare insurance through your employer.  °  How to apply: °Eligibility screenings are held every Tuesday and Wednesday afternoon from 1:00 pm until 4:00 pm. You do not need an appointment for the interview!  °[facility] [address], [place], [place] 336-631-2330   °[facility] [facility]  336-342-8273   °[facility]  336-703-3100   °[facility]  336-570-6415   ° °Behavioral Health Resources in the Community: °Intensive Outpatient Programs °Organization         Address  Phone  Notes  °[facility] [address], [place], [place] 336-878-6098   °[facility] [address], [place], [place] 336-832-9800   °[facility] [address], [place], [place] ° 336-882-2125   °[facility] [address],  °[place], [place] 1-800-853-5163 or 336-641-4981   °Substance Abuse Resources °Organization         Address  Phone  Notes  °[facility]  336-882-2125   °[facility]  336-784-9470   °[facility]  336-285-9073   °[facility]  336-845-3988   °Residential & Outpatient Substance Abuse Program  1-800-659-3381   °Psychological Services °Organization         Address  Phone  Notes  °[facility]  336- 832-9600   °[facility]  336- 378-7881   °[facility] [address], [place] 1-800-853-5163 or 336-641-4981   ° °Mobile Crisis Teams °Organization         Address  Phone  Notes  °[facility], Mobile Crisis Care Unit  1-877-626-1772   °Assertive °[facility] ° [address]  [place], [place] 336-834-9664   °Sharon DeEsch [address] °[place] 336-554-5454   ° °Self-Help/Support Groups °Organization         Address  Phone             Notes  °[facility] [place] - variety of support groups  336- 373-1402 Call for more information  °Narcotics Anonymous (NA), [facility] [address], °[place]  2 meetings at this location  ° °  Residential Treatment Programs °Organization         Address  Phone  Notes  °[facility] [address],    °[place]  1-866-801-8205   °[facility] ° [address], [place], [place] 704-293-8524   °[facility] [address], [place] 336-845-3988 Admissions: 8am-3pm M-F  °[facility] [address]    °[place], [place] 336-841-1104   °[facility] [address], [place], [place] 336-379-7146   °[facility] [address]  °[place], [place] 336-285-9073   °[facility] Intensive Outpatient [address], [place], [place] 336-852-3033   °[facility] [address]  °[place], [place] 1-877-615-2722 or 336-784-9470   °[facility] [address] [place], [place] 336-227-7417 Accepts Medicaid  °[facility] [address]  °[place] 1-800-659-3381 Substance Abuse/Addiction Treatment  ° °[place] Behavioral Health Resources °Organization         Address  Phone  Notes  °[facility]  (888) 581-9988   °Julie Brannon, PhD [address] [place], [place]   (336) 349-5553 or (336) 951-0000   °[facility]   [address] °[place], [place] (336) 349-4454   °[facility] [address], [place], [place] (336) 342-8316 Insurance/Medicaid/sponsorship through [facility]  °[facility] [address]                                    [place], [place] (336) 342-8316 Therapy/tele-psych/case    °[facility] [address]  ° [place], [place] (336) 349-2233    °Dr. Arfeen  (336) 349-4544   °[facility] [place]  [employer] [place] [facility] 1) [address], [place] °2) [address], [place] °3)  [address], [place] (336) 349-3220 °(336) 342-7768 ° °(336) 342-8140   °[place] Child Abuse [facility] (336) 342-1394 or (336) 342-3537 (After Hours)    ° ° °

## 2014-10-20 NOTE — ED Notes (Signed)
Dr Miller at bedside. 
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
## 2014-11-01 NOTE — Discharge Summary (Signed)
Physician Discharge Summary  Patient ID: [name] MRN: [id number] DOB/AGE: [birth year] 28 y.o.  Admit date: 09/15/2014 Discharge date: 09/18/2014  Admission Diagnoses:  Crush injury of left foot  Discharge Diagnoses:  Principal Problem:   Crush injury of left foot Active Problems:   Crushing injury of toe with foot, left   Multiple open fractures of metatarsal bone   Chronic osteomyelitis of left foot   Past Medical History  Diagnosis Date  . Sickle cell trait   . Complication of anesthesia     LACRILUBE  REDNESS     Surgeries: Procedure(s): Left Foot Antibiotic Spacer Removal, 1st MTP Fusion, Left Iliac Crest Bone Graft on 09/15/2014   Consultants (if any):  none  Discharged Condition: Improved  Hospital Course: [name] is an 28 y.o. male who was admitted 09/15/2014 with a diagnosis of Crush injury of left foot and went to the operating room on 09/15/2014 and underwent the above named procedures.    He was given perioperative antibiotics:      Anti-infectives    Start     Dose/Rate Route Frequency Ordered Stop   09/15/14 1245  gentamicin (GARAMYCIN) 150 mg in dextrose 5 % 50 mL IVPB     2 mg/kg  77.1 kg107.5 mL/hr over 30 Minutes Intravenous  Once 09/15/14 1231 09/15/14 1325   09/15/14 0600  ceFAZolin (ANCEF) IVPB 2 g/50 mL premix     2 g100 mL/hr over 30 Minutes Intravenous On call to O.R. 09/14/14 1418 09/15/14 1335    .  He was given sequential compression devices, early ambulation, and aspirin for DVT prophylaxis.  He benefited maximally from the hospital stay and there were no complications.    Recent vital signs:  Filed Vitals:   09/18/14 0505  BP: 111/73  Pulse: 82  Temp: 98.6 F (37 C)  Resp: 16    Recent laboratory studies:  Lab Results  Component Value Date   HGB 15.0 09/15/2014   HGB 14.7 07/07/2014   HGB 15.3 03/02/2014   Lab Results  Component Value Date   WBC 4.5 09/15/2014   PLT 189 09/15/2014   Lab Results   Component Value Date   INR 0.98 09/15/2014   Lab Results  Component Value Date   NA 143 09/15/2014   K 3.7 09/15/2014   CL 104 09/15/2014   CO2 25 09/15/2014   BUN 12 09/15/2014   CREATININE 1.06 09/15/2014   GLUCOSE 108* 09/15/2014    Discharge Medications:     Medication List    STOP taking these medications        HYDROcodone-acetaminophen 5-325 MG per tablet  Commonly known as:  NORCO/VICODIN      TAKE these medications        methocarbamol 500 MG tablet  Commonly known as:  ROBAXIN  Take 1 tablet (500 mg total) by mouth every 6 (six) hours as needed for muscle spasms (spasm).     oxyCODONE-acetaminophen 5-325 MG per tablet  Commonly known as:  ROXICET  Take 1-2 tablets by mouth every 4 (four) hours as needed.        Diagnostic Studies: No results found.  Disposition: 01-Home or Self Care  Discharge Instructions    Call MD / Call 911    Complete by:  As directed   If you experience chest pain or shortness of breath, CALL 911 and be transported to the hospital emergency room.  If you develope a fever above 101 F, pus (white drainage) or  increased drainage or redness at the wound, or calf pain, call your surgeon's office.     Constipation Prevention    Complete by:  As directed   Drink plenty of fluids.  Prune juice may be helpful.  You may use a stool softener, such as Colace (over the counter) 100 mg twice a day.  Use MiraLax (over the counter) for constipation as needed.     Diet - low sodium heart healthy    Complete by:  As directed      Increase activity slowly as tolerated    Complete by:  As directed      Non weight bearing    Complete by:  As directed   Laterality:  left         Elevate foot at rest.   NON WEIGHT BEARING ON LEFT FOOT Wear Darko shoe when out of bed. Keep dry and clean. Change dressing of the left hip daily or as needed. Ice packs to hip daily for pain and swelling.  Follow-up Information    Follow up with Eldred MangesYATES,MARK C, MD.  Schedule an appointment as soon as possible for a visit in 2 weeks.   Specialty:  Orthopedic Surgery   Contact information:   [address] [place] [address] [phone]        Signed: Wende NeighborsVERNON,Harmony Sandell M 11/01/2014, 8:40 AM

## 2014-12-29 ENCOUNTER — Other Ambulatory Visit (HOSPITAL_COMMUNITY): Payer: Self-pay

## 2014-12-29 ENCOUNTER — Other Ambulatory Visit (HOSPITAL_COMMUNITY): Payer: Self-pay | Admitting: Orthopaedic Surgery

## 2014-12-30 MED ORDER — CEFAZOLIN SODIUM-DEXTROSE 2-3 GM-% IV SOLR
2.0000 g | INTRAVENOUS | Status: AC
  Administered 2014-12-31: 2 g via INTRAVENOUS
  Filled 2014-12-30: qty 50

## 2014-12-30 NOTE — Anesthesia Preprocedure Evaluation (Addendum)
Anesthesia Evaluation  Patient identified by MRN, date of birth, ID band Patient awake    Reviewed: Allergy & Precautions, NPO status , Patient's Chart, lab work & pertinent test results, reviewed documented beta blocker date and time   History of Anesthesia Complications History of anesthetic complications: redness in eyes from lacrilube.  Airway Mallampati: II   Neck ROM: Full    Dental  (+) Teeth Intact, Dental Advisory Given   Pulmonary former smoker,  breath sounds clear to auscultation        Cardiovascular negative cardio ROS  Rhythm:Regular     Neuro/Psych    GI/Hepatic negative GI ROS, Neg liver ROS,   Endo/Other  negative endocrine ROS  Renal/GU negative Renal ROS     Musculoskeletal   Abdominal (+)  Abdomen: soft.    Peds  Hematology negative hematology ROS (+)   Anesthesia Other Findings   Reproductive/Obstetrics                            Anesthesia Physical Anesthesia Plan  ASA: II  Anesthesia Plan: General   Post-op Pain Management:    Induction: Intravenous  Airway Management Planned: LMA  Additional Equipment:   Intra-op Plan:   Post-operative Plan: Extubation in OR  Informed Consent: I have reviewed the patients History and Physical, chart, labs and discussed the procedure including the risks, benefits and alternatives for the proposed anesthesia with the patient or authorized representative who has indicated his/her understanding and acceptance.     Plan Discussed with:   Anesthesia Plan Comments:         Anesthesia Quick Evaluation

## 2014-12-30 NOTE — H&P (Signed)
  [facility]   A Division of [facility]   [address], [place], [place] [address] Telephone: [phone]  Fax: [phone]     PATIENT: Mike CraneCovington, Nthony   MR#: [id number]  DOB: [birth year]   Visit Date: 12/17/2014     The patient returns for followup of left foot crush injury.  He has had an external fixator applied.  Had iliac crest bone graft to fill significant defect at a fused 1st MTP joint and spanned all the way to the base of the 1st metatarsal.  External fixator is starting to get loose.  He is progressively healing the graft and repeat x-rays today show interval healing as expected.  His other metatarsal fractures, including the 5th, the 4th base, 2nd shaft and 3rd shaft, have all healed.  He has had no drainage, no cellulitis.  He has intermittent swelling.  The fixator pins are beginning to get loose with slight motion.  He notes that at the end of the day sometimes swelling comes up almost to the base of the external fixator connection.  Grafting was on 09/15/14.  He has now made it 3 months.     PHYSICAL EXAMINATION:  The patient is alert and oriented.  Extraocular movements intact.  Lungs are clear.  Heart regular rate and rhythm.  He has good pulses.  Dorsal areas over the left foot are completely healed.  Good capillary refill of the great toe.  He has slight shortening of the great toe compared to the 2nd due to the severe injuries.     RADIOGRAPHS:  Three-view x-rays of the left foot obtained today show some progressive consolidation.  He appears to be healing at the proximal junction.  Distal junction also appears to have progressed some, but not as quick as the proximal area.     PLAN:  Plan is for external fixator removal and application of a short leg fiberglass cast.  We will build up the heel excessively so that he will be weightbearing through the heel and avoid weightbearing in the forefoot.  This will take a little bit of  extra time under anesthesia to get it appropriately built up so it is similar to a Darco shoe.  The plan is for casting for 6 weeks, and then he may need a special brace or possibly further casting.  We will take the external fixator off, apply short leg fiberglass cast as planned.  He remains out of work.     For additional information please see handwritten notes, reports, orders and prescriptions in this chart.      Mark C. Ophelia CharterYates, M.D.    Auto-Authenticated by Veverly FellsMark C. Ophelia CharterYates, M.D.  MCY/sw DD: 12/17/2014  DT: 12/18/2014   cc: Newt Lukesarolyn Fiks, RN, CCM

## 2014-12-30 NOTE — Progress Notes (Signed)
Unable to reach pt by phone, left pre-op instructions on voicemail. Instructed pt to arrive at 8 AM, NPO after MN tonight, instructed him that he may take his Hydrocodone OR Oxycodone if needed, may take his Robaxin if needed. Also instructed him not to wear jewelry, lotions, powders or cologne in the AM and not to bring any valuables with him.

## 2014-12-31 ENCOUNTER — Ambulatory Visit (HOSPITAL_COMMUNITY)
Admission: RE | Admit: 2014-12-31 | Discharge: 2014-12-31 | Disposition: A | Discharge status: Home / Self Care | Payer: Worker's Compensation | Source: Ambulatory Visit | Attending: Orthopaedic Surgery | Admitting: Orthopaedic Surgery

## 2014-12-31 ENCOUNTER — Encounter (HOSPITAL_COMMUNITY)
Admission: RE | Disposition: A | Discharge status: Home / Self Care | Payer: Self-pay | Source: Ambulatory Visit | Attending: Orthopaedic Surgery

## 2014-12-31 ENCOUNTER — Ambulatory Visit (HOSPITAL_COMMUNITY): Payer: Worker's Compensation | Admitting: Anesthesiology

## 2014-12-31 ENCOUNTER — Encounter (HOSPITAL_COMMUNITY): Payer: Self-pay | Admitting: *Deleted

## 2014-12-31 DIAGNOSIS — Z87891 Personal history of nicotine dependence: Secondary | ICD-10-CM | POA: Insufficient documentation

## 2014-12-31 DIAGNOSIS — X58XXXD Exposure to other specified factors, subsequent encounter: Secondary | ICD-10-CM | POA: Insufficient documentation

## 2014-12-31 DIAGNOSIS — S9782XD Crushing injury of left foot, subsequent encounter: Secondary | ICD-10-CM | POA: Diagnosis present

## 2014-12-31 HISTORY — PX: EXTERNAL FIXATION REMOVAL: SHX5040

## 2014-12-31 HISTORY — PX: CAST APPLICATION: SHX380

## 2014-12-31 LAB — CBC
HCT: 43.8 % (ref 39.0–52.0)
Hemoglobin: 14.6 g/dL (ref 13.0–17.0)
MCH: 27.6 pg (ref 26.0–34.0)
MCHC: 33.3 g/dL (ref 30.0–36.0)
MCV: 82.8 fL (ref 78.0–100.0)
Platelets: 181 10*3/uL (ref 150–400)
RBC: 5.29 MIL/uL (ref 4.22–5.81)
RDW: 13.7 % (ref 11.5–15.5)
WBC: 3.3 10*3/uL — ABNORMAL LOW (ref 4.0–10.5)

## 2014-12-31 SURGERY — REMOVAL, EXTERNAL FIXATION DEVICE, LOWER EXTREMITY
Anesthesia: General | Laterality: Left

## 2014-12-31 MED ORDER — FENTANYL CITRATE 0.05 MG/ML IJ SOLN
25.0000 ug | INTRAMUSCULAR | Status: DC | PRN
  Administered 2014-12-31 (×2): 50 ug via INTRAVENOUS

## 2014-12-31 MED ORDER — FENTANYL CITRATE 0.05 MG/ML IJ SOLN
INTRAMUSCULAR | Status: AC
  Filled 2014-12-31: qty 2

## 2014-12-31 MED ORDER — LACTATED RINGERS IV SOLN
INTRAVENOUS | Status: DC
  Administered 2014-12-31: 09:00:00 via INTRAVENOUS

## 2014-12-31 MED ORDER — FENTANYL CITRATE 0.05 MG/ML IJ SOLN
INTRAMUSCULAR | Status: AC
  Filled 2014-12-31: qty 5

## 2014-12-31 MED ORDER — LACTATED RINGERS IV SOLN
INTRAVENOUS | Status: DC | PRN
  Administered 2014-12-31: 10:00:00 via INTRAVENOUS

## 2014-12-31 MED ORDER — OXYCODONE HCL 5 MG PO TABS
ORAL_TABLET | ORAL | Status: AC
  Administered 2014-12-31: 5 mg
  Filled 2014-12-31: qty 1

## 2014-12-31 MED ORDER — LIDOCAINE HCL (CARDIAC) 20 MG/ML IV SOLN
INTRAVENOUS | Status: DC | PRN
  Administered 2014-12-31: 80 mg via INTRAVENOUS

## 2014-12-31 MED ORDER — ONDANSETRON HCL 4 MG/2ML IJ SOLN
INTRAMUSCULAR | Status: AC
  Filled 2014-12-31: qty 2

## 2014-12-31 MED ORDER — DEXAMETHASONE SODIUM PHOSPHATE 4 MG/ML IJ SOLN
INTRAMUSCULAR | Status: DC | PRN
  Administered 2014-12-31: 8 mg via INTRAVENOUS

## 2014-12-31 MED ORDER — OXYCODONE HCL 5 MG PO TABS
ORAL_TABLET | ORAL | Status: AC
  Filled 2014-12-31: qty 1

## 2014-12-31 MED ORDER — HYDROCODONE-ACETAMINOPHEN 5-325 MG PO TABS: 1.0000 | ORAL_TABLET | Freq: Four times a day (QID) | ORAL | Status: DC | PRN

## 2014-12-31 MED ORDER — ROCURONIUM BROMIDE 50 MG/5ML IV SOLN
INTRAVENOUS | Status: AC
  Filled 2014-12-31: qty 1

## 2014-12-31 MED ORDER — ONDANSETRON HCL 4 MG/2ML IJ SOLN
INTRAMUSCULAR | Status: DC | PRN
  Administered 2014-12-31: 4 mg via INTRAVENOUS

## 2014-12-31 MED ORDER — MIDAZOLAM HCL 2 MG/2ML IJ SOLN
INTRAMUSCULAR | Status: AC
  Filled 2014-12-31: qty 2

## 2014-12-31 MED ORDER — OXYCODONE HCL 5 MG PO TABS
5.0000 mg | ORAL_TABLET | Freq: Once | ORAL | Status: AC
  Administered 2014-12-31: 5 mg via ORAL

## 2014-12-31 MED ORDER — GLYCOPYRROLATE 0.2 MG/ML IJ SOLN
INTRAMUSCULAR | Status: AC
  Filled 2014-12-31: qty 3

## 2014-12-31 MED ORDER — NEOSTIGMINE METHYLSULFATE 10 MG/10ML IV SOLN
INTRAVENOUS | Status: AC
  Filled 2014-12-31: qty 1

## 2014-12-31 MED ORDER — FENTANYL CITRATE 0.05 MG/ML IJ SOLN
INTRAMUSCULAR | Status: DC | PRN
  Administered 2014-12-31: 50 ug via INTRAVENOUS
  Administered 2014-12-31: 100 ug via INTRAVENOUS
  Administered 2014-12-31 (×2): 50 ug via INTRAVENOUS

## 2014-12-31 MED ORDER — PROPOFOL 10 MG/ML IV BOLUS
INTRAVENOUS | Status: DC | PRN
  Administered 2014-12-31: 180 mg via INTRAVENOUS

## 2014-12-31 MED ORDER — LIDOCAINE HCL (CARDIAC) 20 MG/ML IV SOLN
INTRAVENOUS | Status: AC
  Filled 2014-12-31: qty 5

## 2014-12-31 MED ORDER — PROPOFOL 10 MG/ML IV BOLUS
INTRAVENOUS | Status: AC
  Filled 2014-12-31: qty 20

## 2014-12-31 MED ORDER — MIDAZOLAM HCL 5 MG/5ML IJ SOLN
INTRAMUSCULAR | Status: DC | PRN
  Administered 2014-12-31: 2 mg via INTRAVENOUS

## 2014-12-31 SURGICAL SUPPLY — 49 items
BANDAGE ELASTIC 4 VELCRO ST LF (GAUZE/BANDAGES/DRESSINGS) ×1 IMPLANT
BANDAGE ELASTIC 6 VELCRO ST LF (GAUZE/BANDAGES/DRESSINGS) ×1 IMPLANT
BANDAGE ESMARK 6X9 LF (GAUZE/BANDAGES/DRESSINGS) IMPLANT
BNDG CMPR 9X6 STRL LF SNTH (GAUZE/BANDAGES/DRESSINGS)
BNDG COHESIVE 6X5 TAN STRL LF (GAUZE/BANDAGES/DRESSINGS) ×1 IMPLANT
BNDG ESMARK 6X9 LF (GAUZE/BANDAGES/DRESSINGS)
BNDG GAUZE ELAST 4 BULKY (GAUZE/BANDAGES/DRESSINGS) ×2 IMPLANT
COVER SURGICAL LIGHT HANDLE (MISCELLANEOUS) ×1 IMPLANT
DRAPE C-ARM 42X72 X-RAY (DRAPES) IMPLANT
DRAPE U-SHAPE 47X51 STRL (DRAPES) ×3 IMPLANT
DRSG ADAPTIC 3X8 NADH LF (GAUZE/BANDAGES/DRESSINGS) ×1 IMPLANT
ELECT REM PT RETURN 9FT ADLT (ELECTROSURGICAL)
ELECTRODE REM PT RTRN 9FT ADLT (ELECTROSURGICAL) ×1 IMPLANT
GAUZE SPONGE 4X4 12PLY STRL (GAUZE/BANDAGES/DRESSINGS) ×3 IMPLANT
GAUZE XEROFORM 5X9 LF (GAUZE/BANDAGES/DRESSINGS) ×2 IMPLANT
GLOVE BIOGEL PI IND STRL 7.5 (GLOVE) ×1 IMPLANT
GLOVE BIOGEL PI IND STRL 8 (GLOVE) ×1 IMPLANT
GLOVE BIOGEL PI INDICATOR 7.5 (GLOVE)
GLOVE BIOGEL PI INDICATOR 8 (GLOVE)
GLOVE ECLIPSE 7.0 STRL STRAW (GLOVE) ×3 IMPLANT
GLOVE ORTHO TXT STRL SZ7.5 (GLOVE) ×3 IMPLANT
GOWN STRL REUS W/ TWL LRG LVL3 (GOWN DISPOSABLE) ×2 IMPLANT
GOWN STRL REUS W/TWL LRG LVL3 (GOWN DISPOSABLE)
KIT BASIN OR (CUSTOM PROCEDURE TRAY) ×1 IMPLANT
KIT ROOM TURNOVER OR (KITS) ×3 IMPLANT
MANIFOLD NEPTUNE II (INSTRUMENTS) ×1 IMPLANT
NS IRRIG 1000ML POUR BTL (IV SOLUTION) ×1 IMPLANT
PACK ORTHO EXTREMITY (CUSTOM PROCEDURE TRAY) ×1 IMPLANT
PAD ARMBOARD 7.5X6 YLW CONV (MISCELLANEOUS) ×4 IMPLANT
PADDING CAST COTTON 6X4 STRL (CAST SUPPLIES) ×3 IMPLANT
PADDING CAST SYNTHETIC 3 NS LF (CAST SUPPLIES) ×8
PADDING CAST SYNTHETIC 3X4 NS (CAST SUPPLIES) IMPLANT
PADDING CAST SYNTHETIC 4 (CAST SUPPLIES) ×4
PADDING CAST SYNTHETIC 4X4 STR (CAST SUPPLIES) IMPLANT
PENCIL BUTTON HOLSTER BLD 10FT (ELECTRODE) IMPLANT
SCOTCHCAST PLUS 3X4 WHITE (CAST SUPPLIES) ×4 IMPLANT
SCOTCHCAST PLUS 4X4 WHITE (CAST SUPPLIES) ×6 IMPLANT
SPONGE LAP 18X18 X RAY DECT (DISPOSABLE) ×1 IMPLANT
SPONGE SCRUB IODOPHOR (GAUZE/BANDAGES/DRESSINGS) ×3 IMPLANT
STOCKINETTE IMPERVIOUS LG (DRAPES) ×1 IMPLANT
SUT ETHILON 3 0 PS 1 (SUTURE) IMPLANT
SUT VIC AB 0 CT1 27 (SUTURE)
SUT VIC AB 0 CT1 27XBRD ANBCTR (SUTURE) IMPLANT
SUT VIC AB 2-0 CT1 27 (SUTURE)
SUT VIC AB 2-0 CT1 TAPERPNT 27 (SUTURE) IMPLANT
TOWEL OR 17X24 6PK STRL BLUE (TOWEL DISPOSABLE) ×4 IMPLANT
TOWEL OR 17X26 10 PK STRL BLUE (TOWEL DISPOSABLE) ×3 IMPLANT
UNDERPAD 30X30 INCONTINENT (UNDERPADS AND DIAPERS) ×3 IMPLANT
WATER STERILE IRR 1000ML POUR (IV SOLUTION) ×2 IMPLANT

## 2014-12-31 NOTE — Discharge Instructions (Signed)

## 2014-12-31 NOTE — Transfer of Care (Signed)
Immediate Anesthesia Transfer of Care Note  Patient: [name]  Procedure(s) Performed: Procedure(s): REMOVAL EXTERNAL FIXATION FOOT AND APPLY SHORT LEG FIBERGLASS CAST (Left) short leg fiberglass cast application (Left)  Patient Location: PACU  Anesthesia Type:General  Level of Consciousness: awake and alert   Airway & Oxygen Therapy: Patient Spontanous Breathing and Patient connected to nasal cannula oxygen  Post-op Assessment: Report given to PACU RN and Post -op Vital signs reviewed and stable  Post vital signs: Reviewed and stable  Complications: No apparent anesthesia complications

## 2014-12-31 NOTE — Anesthesia Postprocedure Evaluation (Signed)
  Anesthesia Post-op Note  Patient: [name]  Procedure(s) Performed: Procedure(s): REMOVAL EXTERNAL FIXATION FOOT AND APPLY SHORT LEG FIBERGLASS CAST (Left) short leg fiberglass cast application (Left)  Patient Location: PACU  Anesthesia Type:General  Level of Consciousness: awake and alert   Airway and Oxygen Therapy: Patient Spontanous Breathing and Patient connected to nasal cannula oxygen  Post-op Pain: mild  Post-op Assessment: Post-op Vital signs reviewed, Patient's Cardiovascular Status Stable, Respiratory Function Stable, Patent Airway and No signs of Nausea or vomiting  Post-op Vital Signs: Reviewed and stable  Last Vitals:  Filed Vitals:   12/31/14 1138  BP:   Pulse: 64  Temp:   Resp: 13    Complications: No apparent anesthesia complications

## 2014-12-31 NOTE — Interval H&P Note (Signed)
History and Physical Interval Note:  12/31/2014 9:21 AM  [name]  has presented today for surgery, with the diagnosis of Left Foot External Fixator  The various methods of treatment have been discussed with the patient and family. After consideration of risks, benefits and other options for treatment, the patient has consented to  Procedure(s): REMOVAL EXTERNAL FIXATION FOOT AND APPLY SHORT LEG FIBERGLASS CAST (Left) short leg fiberglass cast application (Left) as a surgical intervention .  The patient's history has been reviewed, patient examined, no change in status, stable for surgery.  I have reviewed the patient's chart and labs.  Questions were answered to the patient's satisfaction.     [name]   

## 2014-12-31 NOTE — Anesthesia Procedure Notes (Signed)
Procedure Name: LMA Insertion Date/Time: 12/31/2014 10:40 AM Performed by: Armandina GemmaMIRARCHI, Gwenette Wellons Pre-anesthesia Checklist: Patient identified, Timeout performed, Emergency Drugs available, Suction available and Patient being monitored Patient Re-evaluated:Patient Re-evaluated prior to inductionOxygen Delivery Method: Circle system utilized Preoxygenation: Pre-oxygenation with 100% oxygen Intubation Type: IV induction Ventilation: Mask ventilation without difficulty LMA: LMA inserted LMA Size: 4.0 Tube type: Oral Number of attempts: 1 Placement Confirmation: breath sounds checked- equal and bilateral,  ETT inserted through vocal cords under direct vision and positive ETCO2 Tube secured with: Tape Dental Injury: Teeth and Oropharynx as per pre-operative assessment  Comments: IV induction Manny- LMA insertion AM CRNA atraumatic- teeth and mouth as preop

## 2014-12-31 NOTE — Interval H&P Note (Signed)
History and Physical Interval Note:  12/31/2014 9:21 AM  [name]  has presented today for surgery, with the diagnosis of Left Foot External Fixator  The various methods of treatment have been discussed with the patient and family. After consideration of risks, benefits and other options for treatment, the patient has consented to  Procedure(s): REMOVAL EXTERNAL FIXATION FOOT AND APPLY SHORT LEG FIBERGLASS CAST (Left) short leg fiberglass cast application (Left) as a surgical intervention .  The patient's history has been reviewed, patient examined, no change in status, stable for surgery.  I have reviewed the patient's chart and labs.  Questions were answered to the patient's satisfaction.     Trestin Vences C

## 2015-01-01 NOTE — Op Note (Signed)
NAMWende Crease:  Kriz, Mikhi             ACCOUNT NO.:  [id number]  MEDICAL RECORD NO.:  [id number]  LOCATION:  [facility]                         FACILITY:  [facility]  PHYSICIAN:  Sarabi Sockwell C. Ophelia CharterYates, M.D.    DATE OF BIRTH:  [birth year]  DATE OF PROCEDURE:  12/31/2014 DATE OF DISCHARGE:  12/31/2014                              OPERATIVE REPORT   PREOPERATIVE DIAGNOSIS:  Status post severe crush injury, left foot with retained external fixator and first MTP fusion, previous iliac crest bone graft.  POSTOPERATIVE DIAGNOSIS:  Status post severe crush injury, left foot with retained external fixator and first MTP fusion, previous iliac crest bone graft.  PROCEDURE:  Removal of external fixator under general anesthesia and fiberglass short-leg cast application.  SURGEON:  Marlena Barbato C. Ophelia CharterYates, M.D.  ASSISTANTS:  Zonia KiefJames Owens, PA-C.  ANESTHESIA:  General.  BRIEF HISTORY:  A 29 year old male who has had 6-7 operations, originally got his foot crushed in between with a forklift and a metal box or a metal wall.  He had multiple rib fractures, IP dislocation, soft tissue injuries.  The 2nd through 5th metatarsal fractures were all completely healed.  He had resection of the shaft 1st metatarsal due to infection and had delayed bone grafting, filling in the defect held in external fixator.  DESCRIPTION OF PROCEDURE:  After induction of general anesthesia, time- out procedure, scrubbing with Betadine scrub brush; external fixator was removed.  Pins were removed with a power drill, chuck and chuck key. The pin sites had been scrubbed.  Some Xeroform and 4 x 4 was applied and then stockinette and short-leg fiberglass cast was rolled.  A 3 inch fiberglass was used folding over to make a thick pancake, 2 separate rolls to build up his heel to make the type of position that he had with his [facility] shoe unloading the forefoot.  This led him to later do some weightbearing through the heel and avoid loading the  forefoot.  Once the cast was hard and in satisfactory position, the patient was transferred to recovery room and will be discharged home.     Kathya Wilz C. Ophelia CharterYates, M.D.    MCY/MEDQ  D:  12/31/2014  T:  12/31/2014  Job:  [id number]

## 2015-01-03 ENCOUNTER — Encounter (HOSPITAL_COMMUNITY): Payer: Self-pay | Admitting: Orthopaedic Surgery

## 2015-01-23 IMAGING — CT CT 3D ACQUISTION [facility]ST
2 of 4 series · 6 of 16 positions shown, 7 images · non-contrast
Comparison: none

CLINICAL DATA: Abnormal findings on recent x-ray imaging which
demonstrated a fracture of metatarsals of the left foot.

EXAM:
3-DIMENSIONAL CT IMAGE RENDERING ON INDEPENDENT WORKSTATION
TECHNIQUE: 3-dimensional CT images were rendered by post-processing of the
original CT data at independent workstation. The 3-dimensional CT
images were interpreted, and findings were reported in the
accompanying complete CT report for this study.

[Series 4: lfov ext 3.0 b40s · axial · 0.54mm/px · z∈[-323,-152]mm · 3 of 58 slices shown, 4 images]
[im 1/58  soft-tissue]
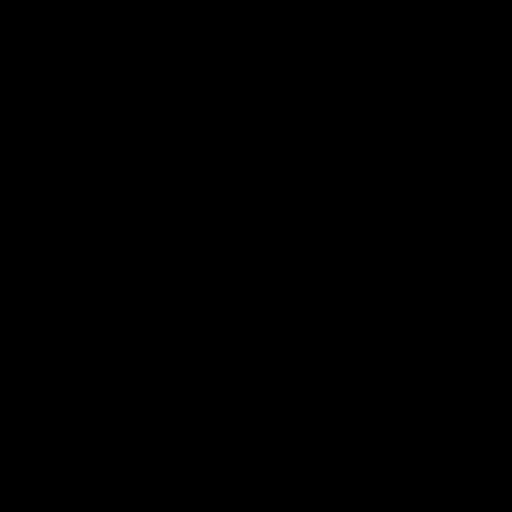
[im 1/58  bone]
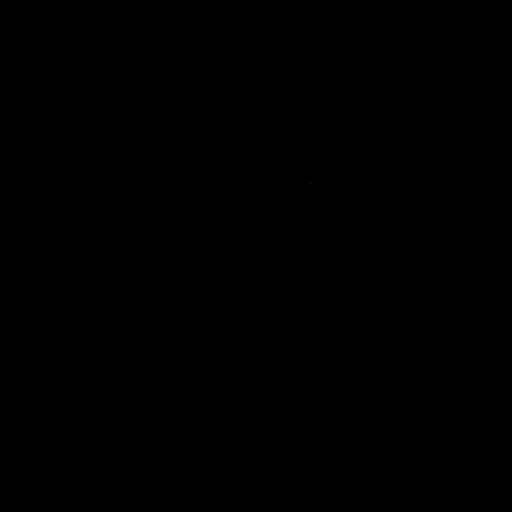
[im 29/58  soft-tissue]
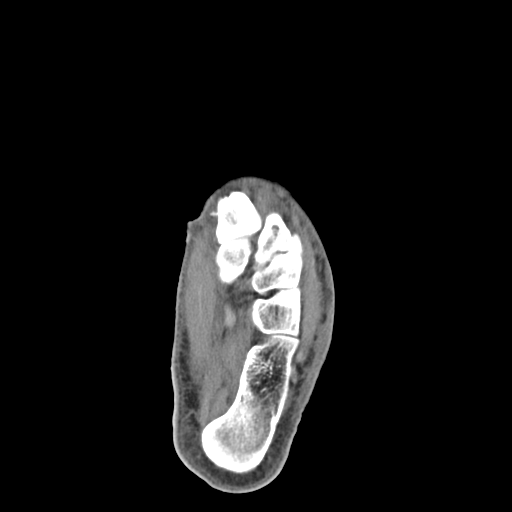
[im 58/58  soft-tissue]
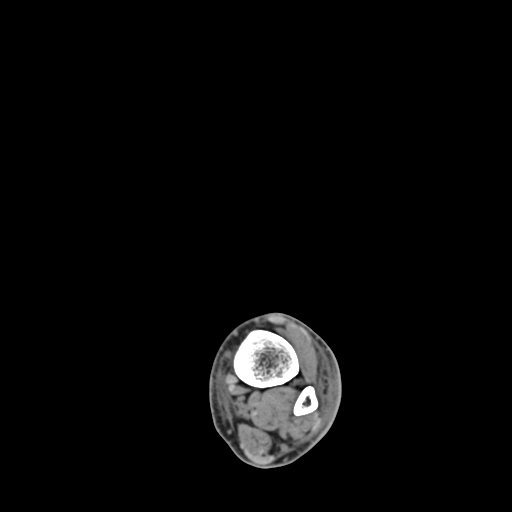

[Series 7: orthogonal axials · coronal · 0.34mm/px · 3 of 138 slices shown]
[im 40/138  soft-tissue]
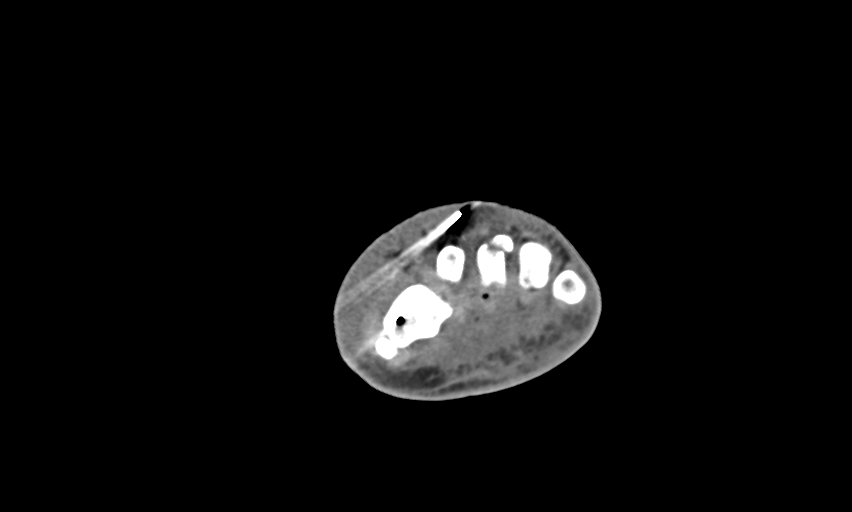
[im 59/138  soft-tissue]
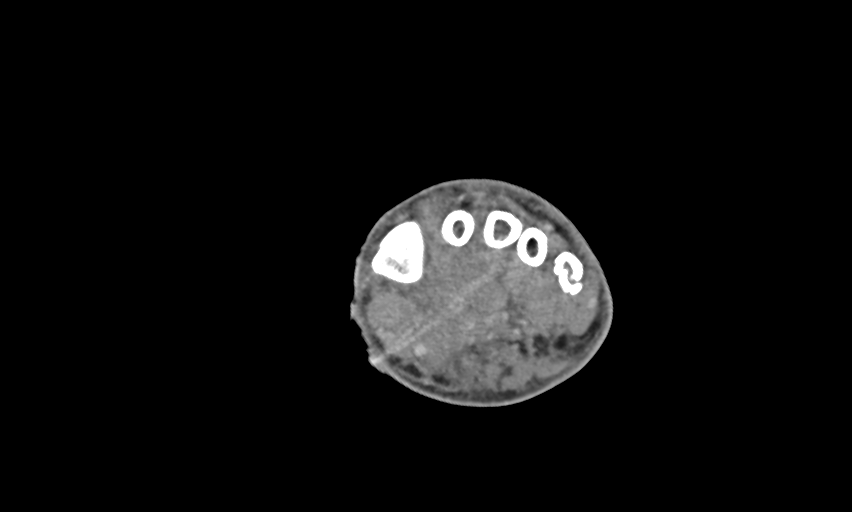
[im 79/138  soft-tissue]
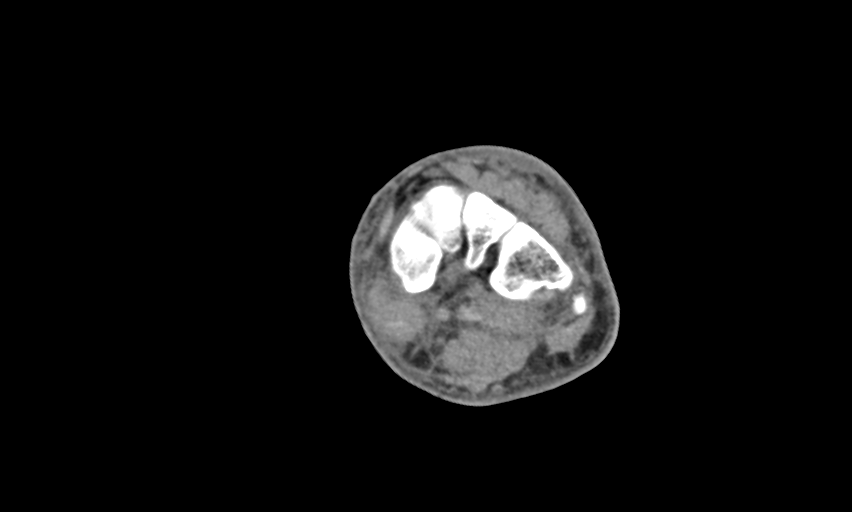

[6 of 16 positions shown; findings below may reference images not displayed]

FINDINGS: Three dimensional reconstructions were performed by the radiologist
at an independent workstation. Complex left foot fracture.
IMPRESSION: As above.

## 2015-05-31 ENCOUNTER — Emergency Department (HOSPITAL_COMMUNITY): Payer: Self-pay

## 2015-05-31 ENCOUNTER — Encounter (HOSPITAL_COMMUNITY): Payer: Self-pay | Admitting: Emergency Medicine

## 2015-05-31 ENCOUNTER — Emergency Department (HOSPITAL_COMMUNITY)
Admission: EM | Admit: 2015-05-31 | Discharge: 2015-05-31 | Disposition: A | Discharge status: Home / Self Care | Payer: Self-pay | Attending: Emergency Medicine | Admitting: Emergency Medicine

## 2015-05-31 DIAGNOSIS — G8929 Other chronic pain: Secondary | ICD-10-CM | POA: Insufficient documentation

## 2015-05-31 DIAGNOSIS — M79672 Pain in left foot: Secondary | ICD-10-CM | POA: Insufficient documentation

## 2015-05-31 DIAGNOSIS — Z791 Long term (current) use of non-steroidal anti-inflammatories (NSAID): Secondary | ICD-10-CM | POA: Insufficient documentation

## 2015-05-31 DIAGNOSIS — Z87891 Personal history of nicotine dependence: Secondary | ICD-10-CM | POA: Insufficient documentation

## 2015-05-31 DIAGNOSIS — Z862 Personal history of diseases of the blood and blood-forming organs and certain disorders involving the immune mechanism: Secondary | ICD-10-CM | POA: Insufficient documentation

## 2015-05-31 MED ORDER — HYDROCODONE-ACETAMINOPHEN 5-325 MG PO TABS: 1.0000 | ORAL_TABLET | Freq: Four times a day (QID) | ORAL | Status: DC | PRN

## 2015-05-31 MED ORDER — OXYCODONE-ACETAMINOPHEN 5-325 MG PO TABS
1.0000 | ORAL_TABLET | Freq: Once | ORAL | Status: AC
  Administered 2015-05-31: 1 via ORAL
  Filled 2015-05-31: qty 1

## 2015-05-31 MED ORDER — DICLOFENAC SODIUM 75 MG PO TBEC: 75.0000 mg | DELAYED_RELEASE_TABLET | Freq: Two times a day (BID) | ORAL | Status: DC

## 2015-05-31 NOTE — ED Notes (Signed)
Pt. reports chronic left foot pain worse this week unrelieved by prescription Norco and Tramadol , denies recent injury.

## 2015-05-31 NOTE — Discharge Instructions (Signed)
Your exam tonight shows that you have pain and inflammation with swelling of the left foot. Elevate the foot as much as possible. Wear the shoe that Dr. Ophelia Charter gave you. Apply ice and take the medication as directed. Call Dr. Ophelia Charter for a follow appointment  Your x-ray today show no acute injury or changes.

## 2015-05-31 NOTE — ED Provider Notes (Signed)
CSN: [id number]     Arrival date & time 05/31/15  2055 History  This chart was scribed for non-physician practitioner Kerrie Buffalo, NP working with Doug Sou, MD by Lyndel Safe, ED Scribe. This patient was seen in room TR10C/TR10C and the patient's care was started at 9:17 PM.    Chief Complaint  Patient presents with  . Foot Pain   Patient is a 29 y.o. male presenting with lower extremity pain. The history is provided by the patient. No language interpreter was used.  Foot Pain This is a chronic problem. The current episode started more than 1 week ago. The problem occurs constantly. The problem has been gradually worsening. The symptoms are aggravated by walking and standing. Nothing relieves the symptoms. Treatments tried: Tramadol and Norco. The treatment provided no relief.   HPI Comments: [name] is a 29 y.o. male, with a PMhx of sickle cell trait, who presents to the Emergency Department complaining of worsening, constant, moderate left foot pain and swelling onset 7 days ago. The pt also notes associated numbness of left toes and radiation of the pain up his left leg. The pt has taken tramadol and Norco without relief. He reports he has chronic left foot pain after an accident approximately 2 years ago when his left foot was crushed but has experienced progressively worsening pain within the past week which he attributes to his surgeon encouraging him to put weight on his left foot. The pt has had several surgeries on his left foot following the accident. He had an appointment with his surgeon Dr. Ophelia Charter 7 days ago for the same complaint of worsening pain where he received diagnostic imaging of his left foot that was unremarkable. He was prescribed Tramadol and Norco and advised to rest and elevate his foot during this appt with Dr. Ophelia Charter. He is out of the Norco.  Denies any recent injury attributable to the pain.    Past Medical History  Diagnosis Date  . Sickle cell trait    . Complication of anesthesia     LACRILUBE  REDNESS    Past Surgical History  Procedure Laterality Date  . I&d extremity Left 09/04/2013    Procedure: IRRIGATION AND DEBRIDEMENT EXTREMITY;  Surgeon: Eldred Manges, MD;  Location: [facility] OR;  Service: Orthopedics;  Laterality: Left;  . Percutaneous pinning Left 09/04/2013    Procedure: PERCUTANEOUS PINNING  LEFT FOOT;  Surgeon: Eldred Manges, MD;  [facility]: [facility] OR;  Service: Orthopedics;  Laterality: Left;  . I&d extremity Left 09/07/2013    Procedure: IRRIGATION AND DEBRIDEMENT EXTREMITY;  Surgeon: Eldred Manges, MD;  Location: [facility] OR;  Service: Orthopedics;  Laterality: Left;  . Orif toe fracture Left 09/07/2013    Procedure: OPEN REDUCTION INTERNAL FIXATION (ORIF)  GREAT TOE IP JOINT DISLOCATION, ORIF 1ST MTP JOINT DISLOCATION AND ORIF 1ST METATARSAL SHAFT FRACTURE.;  Surgeon: Eldred Manges, MD;  Location: [facility] OR;  Service: Orthopedics;  Laterality: Left;  . Hammer toe surgery Left 01/06/2014    Procedure: Left Foot 1st MTP I&D with Bone Resection, and Antibiotic Cement;  Surgeon: Eldred Manges, MD;  Location: [facility] OR;  Service: Orthopedics;  Laterality: Left;  . I&d extremity Left 07/12/2014    Procedure: IRRIGATION AND DEBRIDEMENT EXTREMITY;  Surgeon: Eldred Manges, MD;  Location: [facility] OR;  Service: Orthopedics;  Laterality: Left;  Left Foot 1st Metatarsal Removal Antibiotic Cement Spacers, Bone Debridement, and Placement of Longer Antibiotic Cement Spacer  . Arthrodesis foot with iliac crest  bone graft Left 09/15/2014    Procedure: Left Foot Antibiotic Spacer Removal, 1st MTP Fusion, Left Iliac Crest Bone Graft;  Surgeon: Eldred Manges, MD;  Location: [facility] OR;  Service: Orthopedics;  Laterality: Left;  . External fixation removal Left 12/31/2014    Procedure: REMOVAL EXTERNAL FIXATION FOOT AND APPLY SHORT LEG FIBERGLASS CAST;  Surgeon: Eldred Manges, MD;  Location: [facility] OR;  Service: Orthopedics;  Laterality: Left;  . Cast application Left 12/31/2014    Procedure: short  leg fiberglass cast application;  Surgeon: Eldred Manges, MD;  Location: [facility] OR;  Service: Orthopedics;  Laterality: Left;   No family history on file. History  Substance Use Topics  . Smoking status: Former Smoker -- 0.01 packs/day for 3 years    Quit date: 06/14/2014  . Smokeless tobacco: Never Used  . Alcohol Use: No     Comment: denies    Review of Systems  Musculoskeletal: Positive for myalgias and arthralgias.  Neurological: Positive for numbness.  All other systems reviewed and are negative.  Allergies  Other  Home Medications   Prior to Admission medications   Medication Sig Start Date End Date Taking? Authorizing Provider  diclofenac (VOLTAREN) 75 MG EC tablet Take 1 tablet (75 mg total) by mouth 2 (two) times daily. 05/31/15   Glennice Marcos Orlene Och, NP  HYDROcodone-acetaminophen (NORCO) 5-325 MG per tablet Take 1 tablet by mouth every 6 (six) hours as needed for moderate pain. 05/31/15   Asaad Gulley Orlene Och, NP  methocarbamol (ROBAXIN) 500 MG tablet Take 1 tablet (500 mg total) by mouth every 6 (six) hours as needed for muscle spasms (spasm). 09/15/14   Maud Deed, PA-C  naproxen (NAPROSYN) 500 MG tablet Take 1 tablet (500 mg total) by mouth 2 (two) times daily with a meal. 10/20/14   Eber Hong, MD   BP 113/69 mmHg  Pulse 90  Temp(Src) 98.2 F (36.8 C) (Oral)  Resp 20  Wt 173 lb (78.472 kg)  SpO2 97% Physical Exam  Constitutional: He is oriented to person, place, and time. He appears well-developed and well-nourished.  HENT:  Head: Normocephalic.  Eyes: EOM are normal.  Neck: Neck supple.  Cardiovascular: Normal rate.   DP and TP pulses 2 + left foot.   Pulmonary/Chest: Effort normal.  Abdominal: Soft. There is no tenderness.  Musculoskeletal: Normal range of motion.  Surgical scar noted. Ed[place]out erythema noted to left foot. Pedal pulse 2+, adequate circulation.  Pain with palpation and range of motion.  Neurological: He is alert and oriented to person, place, and  time. No cranial nerve deficit.  Skin: Skin is warm and dry.  Psychiatric: He has a normal mood and affect. His behavior is normal.  Nursing note and vitals reviewed.  ED Course  Procedures  DIAGNOSTIC STUDIES: Oxygen Saturation is 97% on RA, normal by my interpretation.    COORDINATION OF CARE: 9:22 PM Discussed treatment plan which includes diagnostic imaging of left foot. Will also order and prescribe NSAIDs. Pt acknowledges and agrees to plan.   Labs Review Labs Reviewed - No data to display  Imaging Review Dg Foot Complete Left  05/31/2015   CLINICAL DATA:  Crush injury to the foot 2 years ago. Multiple operations. Patient started weight-bearing last week and pain since onset of weight-bearing.  EXAM: LEFT FOOT - COMPLETE 3+ VIEW  COMPARISON:  None.  FINDINGS: Postsurgical, posttraumatic and likely post infectious changes of the first ray are present. Shortening of the great toe is present with disorganized  bone along the expected location of the distal metatarsal shaft.  The bone appears discontiguous in the mid first metatarsal on the frontal projection and oblique projection suggesting nonunion. Poor visualization of the second and third tarsometatarsal junctions, suggesting fusion.  The second, third, and fifth metatarsal fractures are healed. Arthrodesis of the first ray has been performed at the MTP joint and tarsometatarsal joint.  Diffuse forefoot soft tissue swelling.  IMPRESSION: 1. Chronic postsurgical, posttraumatic and likely postinfectious changes of the first ray. 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 2. Healed fractures of the second, third, and fifth metatarsal.   Electronically Signed   By: Andreas Newport M.D.   On: 05/31/2015 21:53     MDM  29 y.o. male with chronic left foot pain s/p surgery 2 years ago. Followed by Dr. Ophelia Charter. Stable for d/c without acute findings on x-ray. No neurovascular compromise, no signs of infection. Discussed  with the patient clinical and x-ray findings and plan of care and all questioned fully answered. He will follow up with Dr. Ophelia Charter or return here if any problems arise. Pain management and NSAIDS, ice and elevation.   Final diagnoses:  Chronic foot pain, left   I personally performed the services described in this documentation, which was scribed in my presence. The recorded information has been reviewed and is accurate.    [place], [place] 05/31/15 2216  Doug Sou, MD 05/31/15 [phone]

## 2015-07-02 ENCOUNTER — Emergency Department (HOSPITAL_COMMUNITY): Payer: No Typology Code available for payment source

## 2015-07-02 ENCOUNTER — Encounter (HOSPITAL_COMMUNITY): Payer: Self-pay

## 2015-07-02 ENCOUNTER — Emergency Department (HOSPITAL_COMMUNITY)
Admission: EM | Admit: 2015-07-02 | Discharge: 2015-07-02 | Disposition: A | Discharge status: Home / Self Care | Payer: Self-pay | Attending: Emergency Medicine | Admitting: Emergency Medicine

## 2015-07-02 ENCOUNTER — Encounter (HOSPITAL_COMMUNITY): Payer: Self-pay | Admitting: Nurse Practitioner

## 2015-07-02 DIAGNOSIS — Z791 Long term (current) use of non-steroidal anti-inflammatories (NSAID): Secondary | ICD-10-CM | POA: Insufficient documentation

## 2015-07-02 DIAGNOSIS — G8929 Other chronic pain: Secondary | ICD-10-CM | POA: Insufficient documentation

## 2015-07-02 DIAGNOSIS — Z87891 Personal history of nicotine dependence: Secondary | ICD-10-CM | POA: Insufficient documentation

## 2015-07-02 DIAGNOSIS — Z862 Personal history of diseases of the blood and blood-forming organs and certain disorders involving the immune mechanism: Secondary | ICD-10-CM | POA: Insufficient documentation

## 2015-07-02 DIAGNOSIS — M79672 Pain in left foot: Secondary | ICD-10-CM | POA: Insufficient documentation

## 2015-07-02 MED ORDER — HYDROCODONE-ACETAMINOPHEN 5-325 MG PO TABS
1.0000 | ORAL_TABLET | Freq: Once | ORAL | Status: AC
  Administered 2015-07-02: 1 via ORAL
  Filled 2015-07-02: qty 1

## 2015-07-02 MED ORDER — ONDANSETRON 4 MG PO TBDP
4.0000 mg | ORAL_TABLET | Freq: Once | ORAL | Status: AC
  Administered 2015-07-02: 4 mg via ORAL
  Filled 2015-07-02: qty 1

## 2015-07-02 MED ORDER — HYDROCODONE-ACETAMINOPHEN 5-325 MG PO TABS: 1.0000 | ORAL_TABLET | Freq: Four times a day (QID) | ORAL | Status: DC | PRN

## 2015-07-02 NOTE — ED Notes (Signed)
He had a crush injury to L foot 2 years ago, has been followed by Dr Ophelia Charter for the injury. He is concerned because this week the pain has been worse. He denies any new injuries. He is wearing a boot and using crutches. He tried norco at home with some relief but he ran out

## 2015-07-02 NOTE — ED Notes (Signed)
Pt is in stable condition upon d/c and is escorted from ED via wheelchair. 

## 2015-07-02 NOTE — ED Provider Notes (Signed)
CSN: [id number]     Arrival date & time 07/02/15  1249 History  This chart was scribed for non-physician practitioner, Marlon Pel, PA-C, working with Gwyneth Sprout, MD, by Ronney Lion, ED Scribe. This patient was seen in room TR06C/TR06C and the patient's care was started at 2:05 PM.     Chief Complaint  Patient presents with  . Foot Pain   The history is provided by the patient. No language interpreter was used.    HPI Comments: [name] is a 29 y.o. male with a history of chronic left foot pain due to a crush injury 2 years after getting his foot caught between a fork lift and a steel plate, as well as 7 surgeries by Dr. Ophelia Charter to repair his foot (see PSHx below) who presents to the Emergency Department complaining of an acute exacerbation of his chronic left foot pain which occurred without provocation; he denies any new injuries or changes in activity. He states he has had a similar pain in the past around the time of his surgeries, but he is unsure what triggered his pain this time. Patient states he ran out of his Norco last week after being seen for the same pain he has right now, Dr. Ophelia Charter  Sent him for an MRI of the right foot and he is waiting for the results.; he states he was given 20-30 tablets that he had taken BID, he cannot remember how many he was written for. He states he tried to contact Dr. Ophelia Charter' office, but was unable to contact him because it is a [place], so he came to the ED for a refill of Norco due to pain. He denies a history of DM.  Patient states he did not drive today.  Past Medical History  Diagnosis Date  . Sickle cell trait   . Complication of anesthesia     LACRILUBE  REDNESS    Past Surgical History  Procedure Laterality Date  . I&d extremity Left 09/04/2013    Procedure: IRRIGATION AND DEBRIDEMENT EXTREMITY;  Surgeon: Eldred Manges, MD;  Location: [facility] OR;  Service: Orthopedics;  Laterality: Left;  . Percutaneous pinning Left 09/04/2013     Procedure: PERCUTANEOUS PINNING  LEFT FOOT;  Surgeon: Eldred Manges, MD;  Location: [facility] OR;  Service: Orthopedics;  Laterality: Left;  . I&d extremity Left 09/07/2013    Procedure: IRRIGATION AND DEBRIDEMENT EXTREMITY;  Surgeon: Eldred Manges, MD;  Location: [facility] OR;  Service: Orthopedics;  Laterality: Left;  . Orif toe fracture Left 09/07/2013    Procedure: OPEN REDUCTION INTERNAL FIXATION (ORIF)  GREAT TOE IP JOINT DISLOCATION, ORIF 1ST MTP JOINT DISLOCATION AND ORIF 1ST METATARSAL SHAFT FRACTURE.;  Surgeon: Eldred Manges, MD;  Location: [facility] OR;  Service: Orthopedics;  Laterality: Left;  . Hammer toe surgery Left 01/06/2014    Procedure: Left Foot 1st MTP I&D with Bone Resection, and Antibiotic Cement;  Surgeon: Eldred Manges, MD;  Location: [facility] OR;  Service: Orthopedics;  Laterality: Left;  . I&d extremity Left 07/12/2014    Procedure: IRRIGATION AND DEBRIDEMENT EXTREMITY;  Surgeon: Eldred Manges, MD;  Location: [facility] OR;  Service: Orthopedics;  Laterality: Left;  Left Foot 1st Metatarsal Removal Antibiotic Cement Spacers, Bone Debridement, and Placement of Longer Antibiotic Cement Spacer  . Arthrodesis foot with iliac crest bone graft Left 09/15/2014    Procedure: Left Foot Antibiotic Spacer Removal, 1st MTP Fusion, Left Iliac Crest Bone Graft;  Surgeon: Eldred Manges, MD;  Location: [facility]  OR;  Service: Orthopedics;  Laterality: Left;  . External fixation removal Left 12/31/2014    Procedure: REMOVAL EXTERNAL FIXATION FOOT AND APPLY SHORT LEG FIBERGLASS CAST;  Surgeon: Eldred Manges, MD;  Location: [facility] OR;  Service: Orthopedics;  Laterality: Left;  . Cast application Left 12/31/2014    Procedure: short leg fiberglass cast application;  Surgeon: Eldred Manges, MD;  Location: [facility] OR;  Service: Orthopedics;  Laterality: Left;   History reviewed. No pertinent family history. History  Substance Use Topics  . Smoking status: Former Smoker -- 0.01 packs/day for 3 years    Quit date: 06/14/2014  . Smokeless tobacco: Never Used   . Alcohol Use: No     Comment: denies    Review of Systems  Musculoskeletal: Positive for myalgias (left foot pain).  .A complete 10 system review of systems was obtained and all systems are negative except as noted in the HPI and PMH.     Allergies  Other  Home Medications   Prior to Admission medications   Medication Sig Start Date End Date Taking? Authorizing Provider  diclofenac (VOLTAREN) 75 MG EC tablet Take 1 tablet (75 mg total) by mouth 2 (two) times daily. 05/31/15   Hope Orlene Och, NP  HYDROcodone-acetaminophen (NORCO) 5-325 MG per tablet Take 1 tablet by mouth every 6 (six) hours as needed for moderate pain. 05/31/15   Hope Orlene Och, NP  HYDROcodone-acetaminophen (NORCO/VICODIN) 5-325 MG per tablet Take 1 tablet by mouth every 6 (six) hours as needed. 07/02/15   Jesyka Slaght Neva Seat, PA-C  methocarbamol (ROBAXIN) 500 MG tablet Take 1 tablet (500 mg total) by mouth every 6 (six) hours as needed for muscle spasms (spasm). 09/15/14   Maud Deed, PA-C  naproxen (NAPROSYN) 500 MG tablet Take 1 tablet (500 mg total) by mouth 2 (two) times daily with a meal. 10/20/14   Eber Hong, MD   BP 109/60 mmHg  Pulse 70  Temp(Src) 98.2 F (36.8 C) (Oral)  Resp 20  SpO2 98% Physical Exam  Constitutional: He is oriented to person, place, and time. He appears well-developed and well-nourished. No distress.  HENT:  Head: Normocephalic and atraumatic.  Eyes: Conjunctivae and EOM are normal.  Neck: Neck supple. No tracheal deviation present.  Cardiovascular: Normal rate.   Pulmonary/Chest: Effort normal. No respiratory distress.  Musculoskeletal: Normal range of motion.  Shortening of the left great toe (chronic due to surgical procedures), no induration, erythema, rash. Pedal pulses symmetrical, sensation is physiologic of the left foot.  Neurological: He is alert and oriented to person, place, and time.  Skin: Skin is warm and dry.  Psychiatric: He has a normal mood and affect. His behavior  is normal.  Nursing note and vitals reviewed.   ED Course  Procedures (including critical care time)  DIAGNOSTIC STUDIES: Oxygen Saturation is 98% on RA, normal by my interpretation.    COORDINATION OF CARE: 2:13 PM - Pt states he had seen Dr. Ophelia Charter last week and believes he was given an Rx of Norco, 20-30 tablets. However, I am unable to pull up Dr. Ophelia Charter' note on pt's chart review, and pt states he had taken it BID for the past week; he is unable to explain how he ran out of his medication early, if he ran out early. After discussing with patient that we cannot fill any long-term pain medications for chronic pain in the ED, I will give prescribe patient a short-term course of pain medication until he is able to contact Dr.  Yates on Monday.  Pt verbalized understanding and agreed to plan.   MDM   Final diagnoses:  Chronic pain  Left foot pain    Medications  HYDROcodone-acetaminophen (NORCO/VICODIN) 5-325 MG per tablet 1 tablet (not administered)  ondansetron (ZOFRAN-ODT) disintegrating tablet 4 mg (not administered)    28 y.o.[name]'s evaluation in the Emergency Department is complete. It has been determined that no acute conditions requiring further emergency intervention are present at this time. The patient/guardian have been advised of the diagnosis and plan. We have discussed signs and symptoms that warrant return to the ED, such as changes or worsening in symptoms.  Vital signs are stable at discharge. Filed Vitals:   07/02/15 1303  BP: 109/60  Pulse: 70  Temp: 98.2 F (36.8 C)  Resp: 20    Patient/guardian has voiced understanding and agreed to follow-up with the PCP or specialist.  I personally performed the services described in this documentation, which was scribed in my presence. The recorded information has been reviewed and is accurate.    Marlon Pel, PA-C 07/02/15 1429  Gwyneth Sprout, MD 07/02/15 1620

## 2016-05-07 ENCOUNTER — Encounter (HOSPITAL_COMMUNITY): Payer: Self-pay | Admitting: *Deleted

## 2016-05-07 ENCOUNTER — Ambulatory Visit (HOSPITAL_COMMUNITY)
Admission: EM | Admit: 2016-05-07 | Discharge: 2016-05-07 | Disposition: A | Discharge status: Home / Self Care | Payer: No Typology Code available for payment source | Attending: Family Medicine | Admitting: Family Medicine

## 2016-05-07 DIAGNOSIS — Z202 Contact with and (suspected) exposure to infections with a predominantly sexual mode of transmission: Secondary | ICD-10-CM

## 2016-05-07 DIAGNOSIS — D573 Sickle-cell trait: Secondary | ICD-10-CM | POA: Insufficient documentation

## 2016-05-07 DIAGNOSIS — Z87891 Personal history of nicotine dependence: Secondary | ICD-10-CM | POA: Insufficient documentation

## 2016-05-07 DIAGNOSIS — R300 Dysuria: Secondary | ICD-10-CM | POA: Insufficient documentation

## 2016-05-07 MED ORDER — CEFTRIAXONE SODIUM 250 MG IJ SOLR
INTRAMUSCULAR | Status: AC
  Filled 2016-05-07: qty 250

## 2016-05-07 MED ORDER — AZITHROMYCIN 250 MG PO TABS
ORAL_TABLET | ORAL | Status: AC
  Filled 2016-05-07: qty 4

## 2016-05-07 MED ORDER — AZITHROMYCIN 250 MG PO TABS
1000.0000 mg | ORAL_TABLET | Freq: Once | ORAL | Status: AC
  Administered 2016-05-07: 1000 mg via ORAL

## 2016-05-07 MED ORDER — CEFTRIAXONE SODIUM 250 MG IJ SOLR
250.0000 mg | Freq: Once | INTRAMUSCULAR | Status: AC
  Administered 2016-05-07: 250 mg via INTRAMUSCULAR

## 2016-05-07 NOTE — Discharge Instructions (Signed)
We will call with positive test results and treat as indicated  °

## 2016-05-07 NOTE — ED Notes (Signed)
Pt reports     painfull  Urination          X    2 weeks  Off  And  On      pt denys   Any  Urinary  Symptoms

## 2016-05-07 NOTE — ED Provider Notes (Signed)
CSN: [id number]     Arrival date & time 05/07/16  1623 History   First MD Initiated Contact with Patient 05/07/16 1658     Chief Complaint  Patient presents with  . Dysuria   (Consider location/radiation/quality/duration/timing/severity/associated sxs/prior Treatment) Patient is a 30 y.o. male presenting with dysuria. The history is provided by the patient and a friend.  Dysuria This is a new problem. The current episode started more than 1 week ago (girlfriend with sl d/c, she is beimg checked also.). The problem has been gradually worsening. Pertinent negatives include no abdominal pain. Exacerbated by: urination.    Past Medical History  Diagnosis Date  . Sickle cell trait (HCC)   . Complication of anesthesia     LACRILUBE  REDNESS    Past Surgical History  Procedure Laterality Date  . I&d extremity Left 09/04/2013    Procedure: IRRIGATION AND DEBRIDEMENT EXTREMITY;  Surgeon: Eldred MangesMark C Yates, MD;  [facility];  Service: Orthopedics;  Laterality: Left;  . Percutaneous pinning Left 09/04/2013    Procedure: PERCUTANEOUS PINNING  LEFT FOOT;  Surgeon: Eldred MangesMark C Yates, MD;  Location: [facility] OR;  Service: Orthopedics;  Laterality: Left;  . I&d extremity Left 09/07/2013    Procedure: IRRIGATION AND DEBRIDEMENT EXTREMITY;  Surgeon: Eldred MangesMark C Yates, MD;  Location: [facility] OR;  Service: Orthopedics;  Laterality: Left;  . Orif toe fracture Left 09/07/2013    Procedure: OPEN REDUCTION INTERNAL FIXATION (ORIF)  GREAT TOE IP JOINT DISLOCATION, ORIF 1ST MTP JOINT DISLOCATION AND ORIF 1ST METATARSAL SHAFT FRACTURE.;  Surgeon: Eldred MangesMark C Yates, MD;  Location: [facility] OR;  Service: Orthopedics;  Laterality: Left;  . Hammer toe surgery Left 01/06/2014    Procedure: Left Foot 1st MTP I&D with Bone Resection, and Antibiotic Cement;  Surgeon: Eldred MangesMark C Yates, MD;  Location: [facility] OR;  Service: Orthopedics;  Laterality: Left;  . I&d extremity Left 07/12/2014    Procedure: IRRIGATION AND DEBRIDEMENT EXTREMITY;  Surgeon: Eldred MangesMark C Yates, MD;   Location: [facility] OR;  Service: Orthopedics;  Laterality: Left;  Left Foot 1st Metatarsal Removal Antibiotic Cement Spacers, Bone Debridement, and Placement of Longer Antibiotic Cement Spacer  . Arthrodesis foot with iliac crest bone graft Left 09/15/2014    Procedure: Left Foot Antibiotic Spacer Removal, 1st MTP Fusion, Left Iliac Crest Bone Graft;  Surgeon: Eldred MangesMark C Yates, MD;  Location: [facility] OR;  Service: Orthopedics;  Laterality: Left;  . External fixation removal Left 12/31/2014    Procedure: REMOVAL EXTERNAL FIXATION FOOT AND APPLY SHORT LEG FIBERGLASS CAST;  Surgeon: Eldred MangesMark C Yates, MD;  [facility] OR;  Service: Orthopedics;  Laterality: Left;  . Cast application Left 12/31/2014    Procedure: short leg fiberglass cast application;  Surgeon: Eldred MangesMark C Yates, MD;  [facility];  Service: Orthopedics;  Laterality: Left;   History reviewed. No pertinent family history. Social History  Substance Use Topics  . Smoking status: Former Smoker -- 0.01 packs/day for 3 years    Quit date: 06/14/2014  . Smokeless tobacco: Never Used  . Alcohol Use: No     Comment: denies    Review of Systems  Constitutional: Negative.   Cardiovascular: Negative.   Gastrointestinal: Negative.  Negative for abdominal pain.  Genitourinary: Positive for dysuria and frequency. Negative for flank pain, discharge, genital sores and penile pain.    Allergies  Other  Home Medications   Prior to Admission medications   Medication Sig Start Date End Date Taking? Authorizing Provider  diclofenac (VOLTAREN) 75 MG EC tablet  Take 1 tablet (75 mg total) by mouth 2 (two) times daily. 05/31/15   Hope Orlene Och, NP  HYDROcodone-acetaminophen (NORCO) 5-325 MG per tablet Take 1 tablet by mouth every 6 (six) hours as needed for moderate pain. 05/31/15   Hope Orlene Och, NP  HYDROcodone-acetaminophen (NORCO/VICODIN) 5-325 MG per tablet Take 1 tablet by mouth every 6 (six) hours as needed. 07/02/15   Tiffany Neva Seat, PA-C  methocarbamol (ROBAXIN)  500 MG tablet Take 1 tablet (500 mg total) by mouth every 6 (six) hours as needed for muscle spasms (spasm). 09/15/14   Maud Deed, PA-C  naproxen (NAPROSYN) 500 MG tablet Take 1 tablet (500 mg total) by mouth 2 (two) times daily with a meal. 10/20/14   Eber Hong, MD   Meds Ordered and Administered this Visit   Medications  cefTRIAXone (ROCEPHIN) injection 250 mg (not administered)  azithromycin (ZITHROMAX) tablet 1,000 mg (not administered)    BP 102/73 mmHg  Pulse 67  Temp(Src) 99.3 F (37.4 C) (Oral)  Resp 16  SpO2 98% No data found.   Physical Exam  Constitutional: He is oriented to person, place, and time. He appears well-developed and well-nourished.  Abdominal: Soft. Bowel sounds are normal. There is no tenderness.  Neurological: He is alert and oriented to person, place, and time.  Skin: Skin is warm and dry.  Nursing note and vitals reviewed.   ED Course  Procedures (including critical care time)  Labs Review Labs Reviewed  URINE CYTOLOGY ANCILLARY ONLY    Imaging Review No results found.   Visual Acuity Review  Right Eye Distance:   Left Eye Distance:   Bilateral Distance:    Right Eye Near:   Left Eye Near:    Bilateral Near:         MDM   1. Potential exposure to STD        Linna Hoff, MD 05/07/16 1806

## 2016-05-08 LAB — URINE CYTOLOGY ANCILLARY ONLY
Chlamydia: NEGATIVE
NEISSERIA GONORRHEA: POSITIVE — AB
Trichomonas: NEGATIVE

## 2016-05-10 LAB — URINE CYTOLOGY ANCILLARY ONLY: BACTERIAL VAGINITIS: NEGATIVE

## 2016-05-11 ENCOUNTER — Telehealth (HOSPITAL_COMMUNITY): Payer: Self-pay | Admitting: Emergency Medicine

## 2016-05-11 NOTE — ED Notes (Signed)
Opened in error.  Charm RingsErin J Honig, MD 05/11/16 1622

## 2016-05-15 ENCOUNTER — Telehealth (HOSPITAL_COMMUNITY): Payer: Self-pay | Admitting: Emergency Medicine

## 2016-05-15 NOTE — ED Notes (Signed)
Called pt and notified of recent lab results from visit 5/29 Pt ID'd properly... Reports feeling better and sx have subsided  Per Dr. Piedad ClimesHonig,  Notes Recorded by Charm RingsErin J Honig, MD on 05/11/2016 at 4:22 PM Please notify patient of positive gonorrhea test. Other STDs are negative. [facility] Will need to notify sexual partners. EH  Adv pt if sx are not getting better to return  Pt verb understanding Education on safe sex given [facility]

## 2016-09-30 ENCOUNTER — Encounter (HOSPITAL_COMMUNITY): Payer: Self-pay

## 2016-09-30 ENCOUNTER — Emergency Department (HOSPITAL_COMMUNITY): Payer: Self-pay

## 2016-09-30 DIAGNOSIS — Y939 Activity, unspecified: Secondary | ICD-10-CM | POA: Insufficient documentation

## 2016-09-30 DIAGNOSIS — Y999 Unspecified external cause status: Secondary | ICD-10-CM | POA: Insufficient documentation

## 2016-09-30 DIAGNOSIS — X501XXA Overexertion from prolonged static or awkward postures, initial encounter: Secondary | ICD-10-CM | POA: Insufficient documentation

## 2016-09-30 DIAGNOSIS — Z7722 Contact with and (suspected) exposure to environmental tobacco smoke (acute) (chronic): Secondary | ICD-10-CM | POA: Insufficient documentation

## 2016-09-30 DIAGNOSIS — S92215A Nondisplaced fracture of cuboid bone of left foot, initial encounter for closed fracture: Secondary | ICD-10-CM | POA: Insufficient documentation

## 2016-09-30 DIAGNOSIS — Y929 Unspecified place or not applicable: Secondary | ICD-10-CM | POA: Insufficient documentation

## 2016-09-30 NOTE — ED Triage Notes (Signed)
Pt states missed step today, pt twisted foot. Pt complaining of pain to lateral L foot. Pt with previous foot injury. Pt states pain is unrelated to previous injury. Pt states unable to bear weight.

## 2016-10-01 ENCOUNTER — Emergency Department (HOSPITAL_COMMUNITY)
Admission: EM | Admit: 2016-10-01 | Discharge: 2016-10-01 | Disposition: A | Discharge status: Home / Self Care | Payer: Self-pay | Attending: Emergency Medicine | Admitting: Emergency Medicine

## 2016-10-01 DIAGNOSIS — S92215A Nondisplaced fracture of cuboid bone of left foot, initial encounter for closed fracture: Secondary | ICD-10-CM

## 2016-10-01 MED ORDER — HYDROCODONE-ACETAMINOPHEN 5-325 MG PO TABS
1.0000 | ORAL_TABLET | Freq: Once | ORAL | Status: AC
  Administered 2016-10-01: 1 via ORAL
  Filled 2016-10-01: qty 1

## 2016-10-01 MED ORDER — IBUPROFEN 800 MG PO TABS: 800.0000 mg | ORAL_TABLET | Freq: Three times a day (TID) | ORAL | 0 refills | Status: DC

## 2016-10-01 MED ORDER — HYDROCODONE-ACETAMINOPHEN 5-325 MG PO TABS: 1.0000 | ORAL_TABLET | ORAL | 0 refills | Status: DC | PRN

## 2016-10-01 NOTE — Discharge Instructions (Signed)
USE YOUR BOOT AND CRUTCHES AT HOME AND REMEMBER NOT TO BEAR ANY WEIGHT ON THE LEFT FOOT UNTIL SEEN BY DR. Ophelia CharterYATES.

## 2016-10-01 NOTE — ED Notes (Signed)
EDP at bedside  

## 2016-10-01 NOTE — ED Notes (Signed)
Pt verbalized understanding discharge instructions and denies any further needs or questions at this time. VS stable, 

## 2016-10-01 NOTE — ED Provider Notes (Signed)
[facility]-EMERGENCY DEPT Provider Note   CSN: [id number] Arrival date & time: 09/30/16  2148     History   Chief Complaint Chief Complaint  Patient presents with  . Foot Pain    HPI [name] is a 30 y.o. male.  Patient presents with twisting left foot injury today while stepping off a curb. No fall or other injury. He had a previous crush injury to this foot requiring multiple surgeries and has chronic pain that is significantly worse since twisting it this afternoon. No numbness or wound.    The history is provided by the patient. No language interpreter was used.  Foot Pain     Past Medical History:  Diagnosis Date  . Complication of anesthesia    LACRILUBE  REDNESS   . Sickle cell trait [facility])     Patient Active Problem List   Diagnosis Date Noted  . Chronic osteomyelitis of left foot (HCC) 07/12/2014  . Acute osteomyelitis of metatarsal bone of left foot (HCC) 01/06/2014  . Crushing injury of toe with foot, left 09/04/2013    Class: Acute  . Crush injury of left foot 09/04/2013  . Laceration of foot 09/04/2013  . Open dislocation of great toe of left foot 09/04/2013  . Skin tear 09/04/2013  . Multiple open fractures of metatarsal bone 09/04/2013    Class: Acute  . Lisfranc dislocation 09/04/2013    Class: Acute    Past Surgical History:  Procedure Laterality Date  . ARTHRODESIS FOOT WITH ILIAC CREST BONE GRAFT Left 09/15/2014   Procedure: Left Foot Antibiotic Spacer Removal, 1st MTP Fusion, Left Iliac Crest Bone Graft;  Surgeon: Eldred Manges, MD;  Location: [facility] OR;  Service: Orthopedics;  Laterality: Left;  . CAST APPLICATION Left 12/31/2014   Procedure: short leg fiberglass cast application;  Surgeon: Eldred Manges, MD;  Location: [facility] OR;  Service: Orthopedics;  Laterality: Left;  . EXTERNAL FIXATION REMOVAL Left 12/31/2014   Procedure: REMOVAL EXTERNAL FIXATION FOOT AND APPLY SHORT LEG FIBERGLASS CAST;  Surgeon: Eldred Manges, MD;  Location: [facility] OR;  Service:  Orthopedics;  Laterality: Left;  . HAMMER TOE SURGERY Left 01/06/2014   Procedure: Left Foot 1st MTP I&D with Bone Resection, and Antibiotic Cement;  Surgeon: Eldred Manges, MD;  Location: [facility] OR;  Service: Orthopedics;  Laterality: Left;  . I&D EXTREMITY Left 09/04/2013   Procedure: IRRIGATION AND DEBRIDEMENT EXTREMITY;  Surgeon: Eldred Manges, MD;  Location: [facility] OR;  Service: Orthopedics;  Laterality: Left;  . I&D EXTREMITY Left 09/07/2013   Procedure: IRRIGATION AND DEBRIDEMENT EXTREMITY;  Surgeon: Eldred Manges, MD;  Location: [facility];  Service: Orthopedics;  Laterality: Left;  . I&D EXTREMITY Left 07/12/2014   Procedure: IRRIGATION AND DEBRIDEMENT EXTREMITY;  Surgeon: Eldred Manges, MD;  Location: [facility];  Service: Orthopedics;  Laterality: Left;  Left Foot 1st Metatarsal Removal Antibiotic Cement Spacers, Bone Debridement, and Placement of Longer Antibiotic Cement Spacer  . ORIF TOE FRACTURE Left 09/07/2013   Procedure: OPEN REDUCTION INTERNAL FIXATION (ORIF)  GREAT TOE IP JOINT DISLOCATION, ORIF 1ST MTP JOINT DISLOCATION AND ORIF 1ST METATARSAL SHAFT FRACTURE.;  Surgeon: Eldred Manges, MD;  Location: [facility] OR;  Service: Orthopedics;  Laterality: Left;  . PERCUTANEOUS PINNING Left 09/04/2013   Procedure: PERCUTANEOUS PINNING  LEFT FOOT;  Surgeon: Eldred Manges, MD;  Location: [facility] OR;  Service: Orthopedics;  Laterality: Left;       Home Medications    Prior to Admission medications  Medication Sig Start Date End Date Taking? Authorizing Provider  diclofenac (VOLTAREN) 75 MG EC tablet Take 1 tablet (75 mg total) by mouth 2 (two) times daily. 05/31/15   Hope Orlene Och, NP  HYDROcodone-acetaminophen (NORCO) 5-325 MG per tablet Take 1 tablet by mouth every 6 (six) hours as needed for moderate pain. 05/31/15   Hope Orlene Och, NP  HYDROcodone-acetaminophen (NORCO/VICODIN) 5-325 MG per tablet Take 1 tablet by mouth every 6 (six) hours as needed. 07/02/15   Tiffany Neva Seat, PA-C  methocarbamol (ROBAXIN) 500 MG tablet Take  1 tablet (500 mg total) by mouth every 6 (six) hours as needed for muscle spasms (spasm). 09/15/14   Maud Deed, PA-C  naproxen (NAPROSYN) 500 MG tablet Take 1 tablet (500 mg total) by mouth 2 (two) times daily with a meal. 10/20/14   Eber Hong, MD    Family History History reviewed. No pertinent family history.  Social History Social History  Substance Use Topics  . Smoking status: Former Smoker    Packs/day: 0.01    Years: 3.00    Quit date: 06/14/2014  . Smokeless tobacco: Never Used  . Alcohol use No     Comment: denies     Allergies   Other   Review of Systems Review of Systems  Constitutional: Negative for chills and fever.  Musculoskeletal:       See HPI  Skin: Negative.  Negative for wound.  Neurological: Negative.  Negative for numbness.     Physical Exam Updated Vital Signs BP 117/71 (BP Location: Right Arm)   Pulse 99   Temp 98.6 F (37 C) (Oral)   Resp 16   Ht 5\' 6"  (1.676 m)   Wt 77.1 kg   SpO2 98%   BMI 27.44 kg/m   Physical Exam  Constitutional: He is oriented to person, place, and time. He appears well-developed and well-nourished.  Neck: Normal range of motion.  Pulmonary/Chest: Effort normal.  Musculoskeletal: Normal range of motion.  Left foot has well healed surgical scars dorsomedial foot. Moderate edema, no erythema or warmth. Tender to dorsal proximal forefoot. FROM all digits. Ankle nontender.   Neurological: He is alert and oriented to person, place, and time.  Skin: Skin is warm and dry.  Psychiatric: He has a normal mood and affect.     ED Treatments / Results  Labs (all labs ordered are listed, but only abnormal results are displayed) Labs Reviewed - No data to display  EKG  EKG Interpretation None       Radiology Dg Foot Complete Left  Result Date: 09/30/2016 CLINICAL DATA:  Injury, pain to lateral left foot EXAM: LEFT FOOT - COMPLETE 3+ VIEW COMPARISON:  05/01/2016 FINDINGS: Bones are osteopenic. Deformity of  the first digit is similar compared to prior and felt related to prior posttraumatic/postinfectious changes. Fused appearance at the first TMT articulation. There are old fracture deformities of the third and fifth metatarsals. There is irregular lucency within the cuboid bone with extension to the calcaneal cuboidal joint. No dislocation. IMPRESSION: 1. Osteopenia with acute nondisplaced fracture of the cuboid bone, apparent extension to the calcaneal cuboidal interval. 2. Other chronic deformities of the left foot as previously described. Electronically Signed   By: Jasmine Pang M.D.   On: 09/30/2016 23:47    Procedures Procedures (including critical care time)  Medications Ordered in ED Medications - No data to display   Initial Impression / Assessment and Plan / ED Course  I have reviewed the triage vital signs  and the nursing notes.  Pertinent labs & imaging results that were available during my care of the patient were reviewed by me and considered in my medical decision making (see chart for details). Encouraged to follow up with Dr. Ophelia CharterYates, his orthopedist  Clinical Course    Patient with chronic pain from crush injury to left foot presents with new injury and abnormal x-ray indicating non-displaced fracture to cuboid. He instructed to be non-weight bearing, wear his boot (offered splint and he opts for his boot and crutches at home.)  Final Clinical Impressions(s) / ED Diagnoses   Final diagnoses:  None   1. Cuboid fracture left foot.  New Prescriptions New Prescriptions   No medications on file     Elpidio AnisShari Jye Fariss, PA-C 10/01/16 0111    Shon Batonourtney F Horton, MD 10/01/16 [phone]

## 2021-12-10 HISTORY — PX: WISDOM TOOTH EXTRACTION: SHX21

## 2022-07-27 DIAGNOSIS — K429 Umbilical hernia without obstruction or gangrene: Secondary | ICD-10-CM | POA: Diagnosis not present

## 2022-07-27 DIAGNOSIS — M6208 Separation of muscle (nontraumatic), other site: Secondary | ICD-10-CM | POA: Diagnosis not present

## 2022-08-17 ENCOUNTER — Other Ambulatory Visit: Payer: Self-pay | Admitting: Physician Assistant

## 2022-08-17 DIAGNOSIS — K429 Umbilical hernia without obstruction or gangrene: Secondary | ICD-10-CM

## 2022-09-14 ENCOUNTER — Ambulatory Visit
Admission: RE | Admit: 2022-09-14 | Discharge: 2022-09-14 | Disposition: A | Discharge status: Home / Self Care | Payer: BC Managed Care – PPO | Source: Ambulatory Visit | Attending: Physician Assistant | Admitting: Physician Assistant

## 2022-09-14 DIAGNOSIS — K429 Umbilical hernia without obstruction or gangrene: Secondary | ICD-10-CM | POA: Diagnosis not present

## 2022-09-14 DIAGNOSIS — K8020 Calculus of gallbladder without cholecystitis without obstruction: Secondary | ICD-10-CM | POA: Diagnosis not present

## 2022-09-14 DIAGNOSIS — K769 Liver disease, unspecified: Secondary | ICD-10-CM | POA: Diagnosis not present

## 2022-09-14 MED ORDER — IOPAMIDOL (ISOVUE-300) INJECTION 61%
100.0000 mL | Freq: Once | INTRAVENOUS | Status: AC | PRN
  Administered 2022-09-14: 100 mL via INTRAVENOUS

## 2022-12-29 ENCOUNTER — Other Ambulatory Visit: Payer: Self-pay

## 2022-12-29 ENCOUNTER — Emergency Department (HOSPITAL_BASED_OUTPATIENT_CLINIC_OR_DEPARTMENT_OTHER): Payer: BC Managed Care – PPO

## 2022-12-29 ENCOUNTER — Emergency Department (HOSPITAL_BASED_OUTPATIENT_CLINIC_OR_DEPARTMENT_OTHER)
Admission: EM | Admit: 2022-12-29 | Discharge: 2022-12-29 | Disposition: A | Discharge status: Home / Self Care | Payer: BC Managed Care – PPO | Attending: Emergency Medicine | Admitting: Emergency Medicine

## 2022-12-29 ENCOUNTER — Encounter (HOSPITAL_BASED_OUTPATIENT_CLINIC_OR_DEPARTMENT_OTHER): Payer: Self-pay | Admitting: Emergency Medicine

## 2022-12-29 DIAGNOSIS — R109 Unspecified abdominal pain: Secondary | ICD-10-CM | POA: Diagnosis not present

## 2022-12-29 DIAGNOSIS — K429 Umbilical hernia without obstruction or gangrene: Secondary | ICD-10-CM

## 2022-12-29 DIAGNOSIS — K8020 Calculus of gallbladder without cholecystitis without obstruction: Secondary | ICD-10-CM | POA: Diagnosis not present

## 2022-12-29 DIAGNOSIS — R1110 Vomiting, unspecified: Secondary | ICD-10-CM | POA: Diagnosis not present

## 2022-12-29 DIAGNOSIS — R1084 Generalized abdominal pain: Secondary | ICD-10-CM | POA: Diagnosis not present

## 2022-12-29 LAB — COMPREHENSIVE METABOLIC PANEL
ALT: 23 U/L (ref 0–44)
AST: 23 U/L (ref 15–41)
Albumin: 4.6 g/dL (ref 3.5–5.0)
Alkaline Phosphatase: 54 U/L (ref 38–126)
Anion gap: 10 (ref 5–15)
BUN: 15 mg/dL (ref 6–20)
CO2: 26 mmol/L (ref 22–32)
Calcium: 10.2 mg/dL (ref 8.9–10.3)
Chloride: 105 mmol/L (ref 98–111)
Creatinine, Ser: 1.13 mg/dL (ref 0.61–1.24)
GFR, Estimated: >60 mL/min (ref >60)
Glucose, Bld: 148 mg/dL — ABNORMAL HIGH (ref 70–99)
Potassium: 4.2 mmol/L (ref 3.5–5.1)
Sodium: 141 mmol/L (ref 135–145)
Total Bilirubin: 0.7 mg/dL (ref 0.3–1.2)
Total Protein: 7.8 g/dL (ref 6.5–8.1)

## 2022-12-29 LAB — URINALYSIS, ROUTINE W REFLEX MICROSCOPIC
Bilirubin Urine: NEGATIVE
Glucose, UA: NEGATIVE mg/dL
Hgb urine dipstick: NEGATIVE
Ketones, ur: NEGATIVE mg/dL
Leukocytes,Ua: NEGATIVE
Nitrite: NEGATIVE
Protein, ur: NEGATIVE mg/dL
Specific Gravity, Urine: 1.045 — ABNORMAL HIGH (ref 1.005–1.030)
pH: 7 (ref 5.0–8.0)

## 2022-12-29 LAB — CBC
HCT: 46 % (ref 39.0–52.0)
Hemoglobin: 15.4 g/dL (ref 13.0–17.0)
MCH: 27.7 pg (ref 26.0–34.0)
MCHC: 33.5 g/dL (ref 30.0–36.0)
MCV: 82.9 fL (ref 80.0–100.0)
Platelets: 217 10*3/uL (ref 150–400)
RBC: 5.55 MIL/uL (ref 4.22–5.81)
RDW: 13.8 % (ref 11.5–15.5)
WBC: 8.8 10*3/uL (ref 4.0–10.5)
nRBC: 0 % (ref 0.0–0.2)

## 2022-12-29 LAB — LIPASE, BLOOD: Lipase: 11 U/L (ref 11–51)

## 2022-12-29 MED ORDER — MORPHINE SULFATE (PF) 4 MG/ML IV SOLN
4.0000 mg | Freq: Once | INTRAVENOUS | Status: AC
  Administered 2022-12-29: 4 mg via INTRAVENOUS
  Filled 2022-12-29: qty 1

## 2022-12-29 MED ORDER — KETOROLAC TROMETHAMINE 30 MG/ML IJ SOLN
30.0000 mg | Freq: Once | INTRAMUSCULAR | Status: AC
  Administered 2022-12-29: 30 mg via INTRAVENOUS
  Filled 2022-12-29: qty 1

## 2022-12-29 MED ORDER — IOHEXOL 300 MG/ML  SOLN
100.0000 mL | Freq: Once | INTRAMUSCULAR | Status: AC | PRN
  Administered 2022-12-29: 100 mL via INTRAVENOUS

## 2022-12-29 MED ORDER — ONDANSETRON HCL 4 MG/2ML IJ SOLN
4.0000 mg | Freq: Once | INTRAMUSCULAR | Status: AC
  Administered 2022-12-29: 4 mg via INTRAVENOUS
  Filled 2022-12-29: qty 2

## 2022-12-29 MED ORDER — OXYCODONE-ACETAMINOPHEN 5-325 MG PO TABS: 1.0000 | ORAL_TABLET | Freq: Four times a day (QID) | ORAL | 0 refills | Status: DC | PRN

## 2022-12-29 MED ORDER — SODIUM CHLORIDE 0.9 % IV BOLUS
1000.0000 mL | Freq: Once | INTRAVENOUS | Status: AC
  Administered 2022-12-29: 1000 mL via INTRAVENOUS

## 2022-12-29 MED ORDER — PANTOPRAZOLE SODIUM 40 MG PO TBEC: 40.0000 mg | DELAYED_RELEASE_TABLET | Freq: Every day | ORAL | 0 refills | Status: DC

## 2022-12-29 MED ORDER — OXYCODONE-ACETAMINOPHEN 5-325 MG PO TABS
1.0000 | ORAL_TABLET | Freq: Once | ORAL | Status: AC
  Administered 2022-12-29: 1 via ORAL
  Filled 2022-12-29: qty 1

## 2022-12-29 NOTE — ED Provider Notes (Signed)
[place] EMERGENCY DEPARTMENT AT [facility] Provider Note   CSN: [id number] Arrival date & time: 12/29/22  1859     History  Chief Complaint  Patient presents with   Abdominal Pain    [name] is a 37 y.o. male.  He is complaining of severe diffuse abdominal pain nausea and vomiting that started a couple hours ago.  He said he has had this before and they think it is related to his umbilical hernia.  He denies any fevers chills.  No diarrhea constipation urinary symptoms.  He rates the pain as 10 out of 10 has tried nothing for it.  The history is provided by the patient.  Abdominal Pain Pain location:  Epigastric Pain quality: aching   Pain radiates to:  Does not radiate Pain severity:  Severe Onset quality:  Sudden Duration:  3 hours Timing:  Constant Progression:  Unchanged Chronicity:  Recurrent Context: not trauma   Relieved by:  None tried Worsened by:  Nothing Ineffective treatments:  None tried Associated symptoms: nausea and vomiting   Associated symptoms: no chest pain, no cough, no diarrhea, no dysuria, no fever, no hematemesis, no hematochezia, no hematuria and no shortness of breath        Home Medications Prior to Admission medications   Medication Sig Start Date End Date Taking? Authorizing Provider  diclofenac (VOLTAREN) 75 MG EC tablet Take 1 tablet (75 mg total) by mouth 2 (two) times daily. 05/31/15   Janne Napoleon, NP  HYDROcodone-acetaminophen (NORCO) 5-325 MG per tablet Take 1 tablet by mouth every 6 (six) hours as needed for moderate pain. 05/31/15   Janne Napoleon, NP  HYDROcodone-acetaminophen (NORCO/VICODIN) 5-325 MG per tablet Take 1 tablet by mouth every 6 (six) hours as needed. 07/02/15   Marlon Pel, PA-C  HYDROcodone-acetaminophen (NORCO/VICODIN) 5-325 MG tablet Take 1 tablet by mouth every 4 (four) hours as needed. 10/01/16   Elpidio Anis, PA-C  ibuprofen (ADVIL,MOTRIN) 800 MG tablet Take 1 tablet (800 mg total) by  mouth 3 (three) times daily. 10/01/16   Elpidio Anis, PA-C  methocarbamol (ROBAXIN) 500 MG tablet Take 1 tablet (500 mg total) by mouth every 6 (six) hours as needed for muscle spasms (spasm). 09/15/14   Maud Deed, PA-C  naproxen (NAPROSYN) 500 MG tablet Take 1 tablet (500 mg total) by mouth 2 (two) times daily with a meal. 10/20/14   Eber Hong, MD      Allergies    Other    Review of Systems   Review of Systems  Constitutional:  Negative for fever.  Respiratory:  Negative for cough and shortness of breath.   Cardiovascular:  Negative for chest pain.  Gastrointestinal:  Positive for abdominal pain, nausea and vomiting. Negative for diarrhea, hematemesis and hematochezia.  Genitourinary:  Negative for dysuria and hematuria.    Physical Exam Updated Vital Signs BP (!) 138/93 (BP Location: Right Arm)   Pulse 71   Temp 98.2 F (36.8 C) (Oral)   Resp 20   Ht 5\' 6"  (1.676 m)   Wt 95.3 kg   SpO2 100%   BMI 33.89 kg/m  Physical Exam Vitals and nursing note reviewed.  Constitutional:      General: He is not in acute distress.    Appearance: Normal appearance. He is well-developed.  HENT:     Head: Normocephalic and atraumatic.  Eyes:     Conjunctiva/sclera: Conjunctivae normal.  Cardiovascular:     Rate and Rhythm: Normal rate and regular rhythm.  Heart sounds: No murmur heard. Pulmonary:     Effort: Pulmonary effort is normal. No respiratory distress.     Breath sounds: Normal breath sounds.  Abdominal:     Palpations: Abdomen is soft.     Tenderness: There is generalized abdominal tenderness. There is no guarding or rebound.     Hernia: A hernia (Hernia is approximately 1 cm.  It is tender but easily reducible.  He is tender in other areas of the abdomen so it is unclear that this is the source of his pain.) is present. Hernia is present in the umbilical area. There is no hernia in the right femoral area or right inguinal area.  Musculoskeletal:        General:  No swelling.     Cervical back: Neck supple.  Skin:    General: Skin is warm and dry.     Capillary Refill: Capillary refill takes less than 2 seconds.  Neurological:     General: No focal deficit present.     Mental Status: He is alert.     ED Results / Procedures / Treatments   Labs (all labs ordered are listed, but only abnormal results are displayed) Labs Reviewed  COMPREHENSIVE METABOLIC PANEL - Abnormal; Notable for the following components:      Result Value   Glucose, Bld 148 (*)    All other components within normal limits  URINALYSIS, ROUTINE W REFLEX MICROSCOPIC - Abnormal; Notable for the following components:   Color, Urine COLORLESS (*)    Specific Gravity, Urine 1.045 (*)    All other components within normal limits  LIPASE, BLOOD  CBC    EKG None  Radiology CT Abdomen Pelvis W Contrast  Result Date: 12/29/2022 CLINICAL DATA:  Generalized abdominal pain and vomiting. EXAM: CT ABDOMEN AND PELVIS WITH CONTRAST TECHNIQUE: Multidetector CT imaging of the abdomen and pelvis was performed using the standard protocol following bolus administration of intravenous contrast. RADIATION DOSE REDUCTION: This exam was performed according to the departmental dose-optimization program which includes automated exposure control, adjustment of the mA and/or kV according to patient size and/or use of iterative reconstruction technique. CONTRAST:  120mL OMNIPAQUE IOHEXOL 300 MG/ML  SOLN COMPARISON:  09/14/2022. FINDINGS: Lower chest: No acute abnormality. Hepatobiliary: A 9 mm hypervascular focus is present in the left lobe of the liver, hepatic segment 4A, likely fast filling hemangioma. There is a 1.5 cm hypervascular focus in the anterior right lobe of the liver, hepatic segment 8, likely flash filling hemangioma. Few scattered subcentimeter hypodensities are noted in the liver which are too small to further characterize. No biliary ductal dilatation. Stones are present within the  gallbladder. Pancreas: Unremarkable. No pancreatic ductal dilatation or surrounding inflammatory changes. Spleen: Normal in size without focal abnormality. Adrenals/Urinary Tract: The adrenal glands are within normal limits. The kidneys enhance symmetrically. No renal calculus or hydronephrosis. The bladder is unremarkable. Stomach/Bowel: Stomach is within normal limits. Appendix appears normal. No evidence of bowel wall thickening, distention, or fat stranding. No free air or pneumatosis. There is diffuse fatty infiltration of the walls of the colon suggesting chronic inflammatory changes. No acute abnormality is seen. Vascular/Lymphatic: No significant vascular findings are present. No enlarged abdominal or pelvic lymph nodes by size criteria. Reproductive: Prostate is unremarkable. Other: A trace amount of free fluid is noted in the distal right pericolic gutter. Small fat containing umbilical hernia. Small fat containing inguinal hernias are noted bilaterally. Musculoskeletal: No acute or suspicious osseous abnormality. IMPRESSION: 1. No acute intra-abdominal  process. 2. Cholelithiasis. 3. Fatty infiltration of the walls of the colon suggesting chronic inflammatory changes. No acute abnormality is seen. 4. Hepatic hemangiomas. Electronically Signed   By: Brett Fairy M.D.   On: 12/29/2022 20:55    Procedures Procedures    Medications Ordered in ED Medications  sodium chloride 0.9 % bolus 1,000 mL (0 mLs Intravenous Stopped 12/29/22 2203)  ondansetron (ZOFRAN) injection 4 mg (4 mg Intravenous Given 12/29/22 2008)  morphine (PF) 4 MG/ML injection 4 mg (4 mg Intravenous Given 12/29/22 2008)  ketorolac (TORADOL) 30 MG/ML injection 30 mg (30 mg Intravenous Given 12/29/22 2117)  iohexol (OMNIPAQUE) 300 MG/ML solution 100 mL (100 mLs Intravenous Contrast Given 12/29/22 2037)  oxyCODONE-acetaminophen (PERCOCET/ROXICET) 5-325 MG per tablet 1 tablet (1 tablet Oral Given 12/29/22 2152)    ED Course/ Medical  Decision Making/ A&P Clinical Course as of 12/30/22 0948  Sat Dec 29, 2022  1944 Patient had a CT back in October showing small fat-containing umbilical hernia and cholelithiasis no evidence of cholecystitis. [MB]  2127 Patient was able to relax his abdominal wall and I was able to pop through something small and that umbilical hernia. [MB]    Clinical Course User Index [MB] Hayden Rasmussen, MD                             Medical Decision Making Amount and/or Complexity of Data Reviewed Labs: ordered. Radiology: ordered.  Risk Prescription drug management.   This patient complains of abdominal pain nausea vomiting; this involves an extensive number of treatment Options and is a complaint that carries with it a high risk of complications and morbidity. The differential includes gastritis, peptic ulcer disease, hernia, obstruction, diverticulitis, colitis  I ordered, reviewed and interpreted labs, which included CBC with normal white count normal hemoglobin, chemistries normal other than elevated glucose, normal LFTs normal urine I ordered medication IV fluids IV pain medication nausea medication and reviewed PMP when indicated. I ordered imaging studies which included CT abdomen and pelvis and I independently    visualized and interpreted imaging which showed no acute findings.  Does have umbilical hernia with fat. Additional history obtained from patient's mother Previous records obtained and reviewed in epic.  Had a CT about 4 months ago Cardiac monitoring reviewed, normal sinus rhythm Social determinants considered, no significant barriers Critical Interventions: None  After the interventions stated above, I reevaluated the patient and found patient to be symptomatically improved Admission and further testing considered, no indications for admission or further workup at this time.  Will start on PPI and given short course of pain medication.  Given contact information for  general surgery clinic.  Recommended follow-up with PCP and return instructions discussed.         Final Clinical Impression(s) / ED Diagnoses Final diagnoses:  Generalized abdominal pain  Umbilical hernia without obstruction and without gangrene  Gallstones    Rx / DC Orders ED Discharge Orders          Ordered    oxyCODONE-acetaminophen (PERCOCET/ROXICET) 5-325 MG tablet  Every 6 hours PRN        12/29/22 2200    pantoprazole (PROTONIX) 40 MG tablet  Daily        12/29/22 2200              Hayden Rasmussen, MD 12/30/22 [phone]

## 2022-12-29 NOTE — ED Triage Notes (Signed)
  Patient comes in with generalized abdominal pain that started a few hours ago.  Patient endorses 10/10 sharp/stabbing pain.  Has had two episodes of emesis since it occurred.  Patient had bowel movement earlier today. Took pepto bismol around 1830 before coming here.

## 2022-12-29 NOTE — Discharge Instructions (Signed)
You were seen in the emergency department for abdominal pain.  You had signs of an umbilical hernia that was tender but your CAT scan did not show any signs of obstruction.  Your CAT scan did show you had gallstones and this may also cause abdominal pain at times.  Please follow-up with your primary care doctor and the general surgery clinic.  Return to the emergency department if any high fever or worsening symptoms.

## 2023-01-11 DIAGNOSIS — K429 Umbilical hernia without obstruction or gangrene: Secondary | ICD-10-CM | POA: Diagnosis not present

## 2024-01-20 ENCOUNTER — Other Ambulatory Visit: Payer: Self-pay | Admitting: Family Medicine

## 2024-01-20 DIAGNOSIS — R1011 Right upper quadrant pain: Secondary | ICD-10-CM

## 2024-01-21 ENCOUNTER — Ambulatory Visit
Admission: RE | Admit: 2024-01-21 | Discharge: 2024-01-21 | Disposition: A | Discharge status: Home / Self Care | Payer: BC Managed Care – PPO | Source: Ambulatory Visit | Attending: Family Medicine | Admitting: Family Medicine

## 2024-01-21 DIAGNOSIS — R1011 Right upper quadrant pain: Secondary | ICD-10-CM

## 2024-01-28 ENCOUNTER — Emergency Department (HOSPITAL_COMMUNITY)
Admission: EM | Admit: 2024-01-28 | Discharge: 2024-01-28 | Discharge status: Against Medical Advice | Payer: BC Managed Care – PPO | Attending: Emergency Medicine | Admitting: Emergency Medicine

## 2024-01-28 DIAGNOSIS — Z5321 Procedure and treatment not carried out due to patient leaving prior to being seen by health care provider: Secondary | ICD-10-CM | POA: Diagnosis not present

## 2024-01-28 DIAGNOSIS — R1011 Right upper quadrant pain: Secondary | ICD-10-CM | POA: Diagnosis present

## 2024-01-28 LAB — COMPREHENSIVE METABOLIC PANEL
ALT: 37 U/L (ref 0–44)
AST: 17 U/L (ref 15–41)
Albumin: 3.9 g/dL (ref 3.5–5.0)
Alkaline Phosphatase: 56 U/L (ref 38–126)
Anion gap: 8 (ref 5–15)
BUN: 15 mg/dL (ref 6–20)
CO2: 26 mmol/L (ref 22–32)
Calcium: 9.1 mg/dL (ref 8.9–10.3)
Chloride: 104 mmol/L (ref 98–111)
Creatinine, Ser: 1.26 mg/dL — ABNORMAL HIGH (ref 0.61–1.24)
GFR, Estimated: >60 mL/min (ref >60)
Glucose, Bld: 107 mg/dL — ABNORMAL HIGH (ref 70–99)
Potassium: 3.9 mmol/L (ref 3.5–5.1)
Sodium: 138 mmol/L (ref 135–145)
Total Bilirubin: 0.6 mg/dL (ref 0.0–1.2)
Total Protein: 7 g/dL (ref 6.5–8.1)

## 2024-01-28 LAB — URINALYSIS, ROUTINE W REFLEX MICROSCOPIC
Bilirubin Urine: NEGATIVE
Glucose, UA: NEGATIVE mg/dL
Hgb urine dipstick: NEGATIVE
Ketones, ur: NEGATIVE mg/dL
Leukocytes,Ua: NEGATIVE
Nitrite: NEGATIVE
Protein, ur: NEGATIVE mg/dL
Specific Gravity, Urine: 1.019 (ref 1.005–1.030)
pH: 5 (ref 5.0–8.0)

## 2024-01-28 LAB — CBC WITH DIFFERENTIAL/PLATELET
Abs Immature Granulocytes: 0.01 10*3/uL (ref 0.00–0.07)
Basophils Absolute: 0.1 10*3/uL (ref 0.0–0.1)
Basophils Relative: 1 %
Eosinophils Absolute: 0.2 10*3/uL (ref 0.0–0.5)
Eosinophils Relative: 3 %
HCT: 45.9 % (ref 39.0–52.0)
Hemoglobin: 15.3 g/dL (ref 13.0–17.0)
Immature Granulocytes: 0 %
Lymphocytes Relative: 41 %
Lymphs Abs: 2.4 10*3/uL (ref 0.7–4.0)
MCH: 28.2 pg (ref 26.0–34.0)
MCHC: 33.3 g/dL (ref 30.0–36.0)
MCV: 84.7 fL (ref 80.0–100.0)
Monocytes Absolute: 0.5 10*3/uL (ref 0.1–1.0)
Monocytes Relative: 8 %
Neutro Abs: 2.7 10*3/uL (ref 1.7–7.7)
Neutrophils Relative %: 47 %
Platelets: 340 10*3/uL (ref 150–400)
RBC: 5.42 MIL/uL (ref 4.22–5.81)
RDW: 13.6 % (ref 11.5–15.5)
WBC: 5.9 10*3/uL (ref 4.0–10.5)
nRBC: 0 % (ref 0.0–0.2)

## 2024-01-28 LAB — LIPASE, BLOOD: Lipase: 27 U/L (ref 11–51)

## 2024-01-28 NOTE — ED Provider Triage Note (Signed)
 Emergency Medicine Provider Triage Evaluation Note  [name] , a 38 y.o. male  was evaluated in triage.  Pt complains of abdominal pain.  Was sent here from [place] [facility].  Being evaluated for known gallstones and distended gallbladder.  He states that right upper quadrant pain has been persistent.  Advised by general surgery to come here for further evaluation and per his report states that he may need surgery.  Has had recent right upper quadrant ultrasound and CT scan.  Review of Systems  Positive: See above Negative: See above  Physical Exam  BP (!) 109/93 (BP Location: Right Arm)   Pulse 87   Temp 98.2 F (36.8 C) (Oral)   Resp 18   Ht 5\' 7"  (1.702 m)   Wt 99.8 kg   SpO2 99%   BMI 34.46 kg/m  Gen:   Awake, no distress   Resp:  Normal effort  MSK:   Moves extremities without difficulty  Other:    Medical Decision Making  Medically screening exam initiated at 5:30 PM.  Appropriate orders placed.  Marcial L Kettles was informed that the remainder of the evaluation will be completed by another provider, this initial triage assessment does not replace that evaluation, and the importance of remaining in the ED until their evaluation is complete.  Work up started   Gareth Eagle, PA-C 01/28/24 1732

## 2024-01-28 NOTE — ED Triage Notes (Addendum)
 Pt to ED c/o abdominal pain x 1.5 weeks, ongoing problems with gallbladder, here today from general surgery office visit, there today to schedule surgery, told to come to ED, for possible earlier operation.

## 2024-01-28 NOTE — ED Notes (Signed)
Pt LMA

## 2024-02-07 ENCOUNTER — Ambulatory Visit: Payer: Self-pay | Admitting: General Surgery

## 2024-02-07 DIAGNOSIS — K8010 Calculus of gallbladder with chronic cholecystitis without obstruction: Secondary | ICD-10-CM

## 2024-02-13 NOTE — Pre-Procedure Instructions (Signed)
 Surgical Instructions   Your procedure is scheduled on February 20, 2024. Report to [facility] Main Entrance "A" at 5:30 A.M., then check in with the Admitting office. Any questions or running late day of surgery: call [phone]  Questions prior to your surgery date: call [phone], Monday-Friday, 8am-4pm. If you experience any cold or flu symptoms such as cough, fever, chills, shortness of breath, etc. between now and your scheduled surgery, please notify us at the above number.     Remember:  Do not eat after midnight the night before your surgery   You may drink clear liquids until 4:30 AM the morning of your surgery.   Clear liquids allowed are: Water, Non-Citrus Juices (without pulp), Carbonated Beverages, Clear Tea (no milk, honey, etc.), Black Coffee Only (NO MILK, CREAM OR POWDERED CREAMER of any kind), and Gatorade.    Take these medicines the morning of surgery with A SIP OF WATER: amoxicillin-clavulanate (AUGMENTIN)    May take these medicines IF NEEDED: HYDROcodone-acetaminophen (NORCO/VICODIN)    One week prior to surgery, STOP taking any Aspirin (unless otherwise instructed by your surgeon) Aleve, Naproxen, Ibuprofen, Motrin, Advil, Goody's, BC's, all herbal medications, fish oil, and non-prescription vitamins.                     Do NOT Smoke (Tobacco/Vaping) for 24 hours prior to your procedure.  If you use a CPAP at night, you may bring your mask/headgear for your overnight stay.   You will be asked to remove any contacts, glasses, piercing's, hearing aid's, dentures/partials prior to surgery. Please bring cases for these items if needed.    Patients discharged the day of surgery will not be allowed to drive home, and someone needs to stay with them for 24 hours.  SURGICAL WAITING ROOM VISITATION Patients may have no more than 2 support people in the waiting area - these visitors may rotate.   Pre-op nurse will coordinate an appropriate time for 1 ADULT  support person, who may not rotate, to accompany patient in pre-op.  Children under the age of 49 must have an adult with them who is not the patient and must remain in the main waiting area with an adult.  If the patient needs to stay at the hospital during part of their recovery, the visitor guidelines for inpatient rooms apply.  Please refer to the [facility] website for the visitor guidelines for any additional information.   If you received a COVID test during your pre-op visit  it is requested that you wear a mask when out in public, stay away from anyone that may not be feeling well and notify your surgeon if you develop symptoms. If you have been in contact with anyone that has tested positive in the last 10 days please notify you surgeon.      Pre-operative CHG Bathing Instructions   You can play a key role in reducing the risk of infection after surgery. Your skin needs to be as free of germs as possible. You can reduce the number of germs on your skin by washing with CHG (chlorhexidine gluconate) soap before surgery. CHG is an antiseptic soap that kills germs and continues to kill germs even after washing.   DO NOT use if you have an allergy to chlorhexidine/CHG or antibacterial soaps. If your skin becomes reddened or irritated, stop using the CHG and notify one of our RNs at [phone].  TAKE A SHOWER THE NIGHT BEFORE SURGERY AND THE DAY OF SURGERY    Please keep in mind the following:  DO NOT shave, including legs and underarms, 48 hours prior to surgery.   You may shave your face before/day of surgery.  Place clean sheets on your bed the night before surgery Use a clean washcloth (not used since being washed) for each shower. DO NOT sleep with pet's night before surgery.  CHG Shower Instructions:  Wash your face and private area with normal soap. If you choose to wash your hair, wash first with your normal shampoo.  After you use shampoo/soap, rinse your  hair and body thoroughly to remove shampoo/soap residue.  Turn the water OFF and apply half the bottle of CHG soap to a CLEAN washcloth.  Apply CHG soap ONLY FROM YOUR NECK DOWN TO YOUR TOES (washing for 3-5 minutes)  DO NOT use CHG soap on face, private areas, open wounds, or sores.  Pay special attention to the area where your surgery is being performed.  If you are having back surgery, having someone wash your back for you may be helpful. Wait 2 minutes after CHG soap is applied, then you may rinse off the CHG soap.  Pat dry with a clean towel  Put on clean pajamas    Additional instructions for the day of surgery: DO NOT APPLY any lotions, deodorants, cologne, or perfumes.   Do not wear jewelry or makeup Do not wear nail polish, gel polish, artificial nails, or any other type of covering on natural nails (fingers and toes) Do not bring valuables to the hospital. [facility] is not responsible for valuables/personal belongings. Put on clean/comfortable clothes.  Please brush your teeth.  Ask your nurse before applying any prescription medications to the skin.

## 2024-02-14 ENCOUNTER — Other Ambulatory Visit: Payer: Self-pay

## 2024-02-14 ENCOUNTER — Encounter (HOSPITAL_COMMUNITY)
Admission: RE | Admit: 2024-02-14 | Discharge: 2024-02-14 | Disposition: A | Discharge status: Home / Self Care | Payer: BC Managed Care – PPO | Source: Ambulatory Visit | Attending: General Surgery | Admitting: General Surgery

## 2024-02-14 ENCOUNTER — Encounter (HOSPITAL_COMMUNITY): Payer: Self-pay

## 2024-02-14 DIAGNOSIS — K8010 Calculus of gallbladder with chronic cholecystitis without obstruction: Secondary | ICD-10-CM | POA: Diagnosis not present

## 2024-02-14 DIAGNOSIS — Z01812 Encounter for preprocedural laboratory examination: Secondary | ICD-10-CM | POA: Insufficient documentation

## 2024-02-14 LAB — COMPREHENSIVE METABOLIC PANEL
ALT: 25 U/L (ref 0–44)
AST: 19 U/L (ref 15–41)
Albumin: 3.7 g/dL (ref 3.5–5.0)
Alkaline Phosphatase: 54 U/L (ref 38–126)
Anion gap: 5 (ref 5–15)
BUN: 12 mg/dL (ref 6–20)
CO2: 28 mmol/L (ref 22–32)
Calcium: 9.2 mg/dL (ref 8.9–10.3)
Chloride: 107 mmol/L (ref 98–111)
Creatinine, Ser: 1.21 mg/dL (ref 0.61–1.24)
GFR, Estimated: >60 mL/min (ref >60)
Glucose, Bld: 101 mg/dL — ABNORMAL HIGH (ref 70–99)
Potassium: 3.7 mmol/L (ref 3.5–5.1)
Sodium: 140 mmol/L (ref 135–145)
Total Bilirubin: 1.2 mg/dL (ref 0.0–1.2)
Total Protein: 6.7 g/dL (ref 6.5–8.1)

## 2024-02-14 NOTE — Progress Notes (Signed)
 PCP - Dr. Tally Joe  Cardiologist - Denies  PPM/ICD - Denies Device Orders - n/a Rep Notified - n/a  Chest x-ray - n/a EKG - Denies recent EKG. Last one in 2015 Stress Test - Denies ECHO - Denies Cardiac Cath - Denies  Sleep Study - Denies CPAP - n/a  No DM  Last dose of GLP1 agonist- n/a GLP1 instructions: n/a  Blood Thinner Instructions: n/a Aspirin Instructions: n/a  ERAS Protcol - Clear liquids until 0430 morning of surgery PRE-SURGERY Ensure or G2- n/a  COVID TEST- n/a   Anesthesia review: No.   Patient denies shortness of breath, fever, cough and chest pain at PAT appointment. Pt denies any respiratory illness/infection in the last two months.   All instructions explained to the patient, with a verbal understanding of the material. Patient agrees to go over the instructions while at home for a better understanding. Patient also instructed to self quarantine after being tested for COVID-19. The opportunity to ask questions was provided.

## 2024-02-19 NOTE — Anesthesia Preprocedure Evaluation (Signed)
 Anesthesia Evaluation  Patient identified by MRN, date of birth, ID band Patient awake    Reviewed: Allergy & Precautions, NPO status , Patient's Chart, lab work & pertinent test results  History of Anesthesia Complications Negative for: history of anesthetic complications  Airway Mallampati: II  TM Distance: >3 FB Neck ROM: Full    Dental  (+) Dental Advisory Given, Chipped,    Pulmonary Current Smoker and Patient abstained from smoking.   Pulmonary exam normal        Cardiovascular negative cardio ROS Normal cardiovascular exam     Neuro/Psych negative neurological ROS  negative psych ROS   GI/Hepatic negative GI ROS, Neg liver ROS,,,  Endo/Other   Obesity   Renal/GU negative Renal ROS     Musculoskeletal negative musculoskeletal ROS (+)    Abdominal   Peds  Hematology  (+) Blood dyscrasia, Sickle cell trait   Anesthesia Other Findings   Reproductive/Obstetrics                             Anesthesia Physical Anesthesia Plan  ASA: 2  Anesthesia Plan: General   Post-op Pain Management: Tylenol PO (pre-op)* and Celebrex PO (pre-op)*   Induction: Intravenous  PONV Risk Score and Plan: 1 and Treatment may vary due to age or medical condition, Ondansetron, Dexamethasone and Midazolam  Airway Management Planned: Oral ETT  Additional Equipment: None  Intra-op Plan:   Post-operative Plan: Extubation in OR  Informed Consent: I have reviewed the patients History and Physical, chart, labs and discussed the procedure including the risks, benefits and alternatives for the proposed anesthesia with the patient or authorized representative who has indicated his/her understanding and acceptance.     Dental advisory given  Plan Discussed with: CRNA and Anesthesiologist  Anesthesia Plan Comments:        Anesthesia Quick Evaluation

## 2024-02-20 ENCOUNTER — Ambulatory Visit (HOSPITAL_COMMUNITY): Payer: Self-pay | Admitting: Anesthesiology

## 2024-02-20 ENCOUNTER — Ambulatory Visit (HOSPITAL_COMMUNITY)
Admission: RE | Admit: 2024-02-20 | Discharge: 2024-02-20 | Disposition: A | Discharge status: Home / Self Care | Payer: BC Managed Care – PPO | Attending: General Surgery | Admitting: General Surgery

## 2024-02-20 ENCOUNTER — Encounter (HOSPITAL_COMMUNITY)
Admission: RE | Disposition: A | Discharge status: Home / Self Care | Payer: Self-pay | Source: Home / Self Care | Attending: General Surgery

## 2024-02-20 ENCOUNTER — Encounter (HOSPITAL_COMMUNITY): Payer: Self-pay | Admitting: General Surgery

## 2024-02-20 ENCOUNTER — Other Ambulatory Visit: Payer: Self-pay

## 2024-02-20 ENCOUNTER — Ambulatory Visit (HOSPITAL_COMMUNITY)

## 2024-02-20 DIAGNOSIS — D573 Sickle-cell trait: Secondary | ICD-10-CM | POA: Insufficient documentation

## 2024-02-20 DIAGNOSIS — K8012 Calculus of gallbladder with acute and chronic cholecystitis without obstruction: Secondary | ICD-10-CM | POA: Diagnosis present

## 2024-02-20 DIAGNOSIS — K420 Umbilical hernia with obstruction, without gangrene: Secondary | ICD-10-CM | POA: Insufficient documentation

## 2024-02-20 DIAGNOSIS — F17210 Nicotine dependence, cigarettes, uncomplicated: Secondary | ICD-10-CM | POA: Insufficient documentation

## 2024-02-20 HISTORY — PX: UMBILICAL HERNIA REPAIR: SHX196

## 2024-02-20 HISTORY — PX: CHOLECYSTECTOMY: SHX55

## 2024-02-20 SURGERY — LAPAROSCOPIC CHOLECYSTECTOMY WITH INTRAOPERATIVE CHOLANGIOGRAM
Anesthesia: General | Site: Abdomen

## 2024-02-20 MED ORDER — ONDANSETRON HCL 4 MG/2ML IJ SOLN
INTRAMUSCULAR | Status: AC
  Filled 2024-02-20: qty 2

## 2024-02-20 MED ORDER — AMISULPRIDE (ANTIEMETIC) 5 MG/2ML IV SOLN: 10.0000 mg | Freq: Once | INTRAVENOUS | Status: DC | PRN

## 2024-02-20 MED ORDER — DEXAMETHASONE SODIUM PHOSPHATE 10 MG/ML IJ SOLN
INTRAMUSCULAR | Status: DC | PRN
  Administered 2024-02-20: 10 mg via INTRAVENOUS

## 2024-02-20 MED ORDER — ROCURONIUM BROMIDE 10 MG/ML (PF) SYRINGE
PREFILLED_SYRINGE | INTRAVENOUS | Status: AC
  Filled 2024-02-20: qty 10

## 2024-02-20 MED ORDER — SUGAMMADEX SODIUM 200 MG/2ML IV SOLN
INTRAVENOUS | Status: DC | PRN
  Administered 2024-02-20: 200 mg via INTRAVENOUS

## 2024-02-20 MED ORDER — PROPOFOL 10 MG/ML IV BOLUS
INTRAVENOUS | Status: DC | PRN
Start: 2024-02-20 — End: 2024-02-20
  Administered 2024-02-20: 200 mg via INTRAVENOUS

## 2024-02-20 MED ORDER — ROCURONIUM BROMIDE 10 MG/ML (PF) SYRINGE
PREFILLED_SYRINGE | INTRAVENOUS | Status: DC | PRN
  Administered 2024-02-20: 20 mg via INTRAVENOUS
  Administered 2024-02-20: 50 mg via INTRAVENOUS
  Administered 2024-02-20: 10 mg via INTRAVENOUS

## 2024-02-20 MED ORDER — SUGAMMADEX SODIUM 200 MG/2ML IV SOLN
INTRAVENOUS | Status: AC
  Filled 2024-02-20: qty 2

## 2024-02-20 MED ORDER — HEPARIN SODIUM (PORCINE) 5000 UNIT/ML IJ SOLN
5000.0000 [IU] | Freq: Once | INTRAMUSCULAR | Status: AC
Start: 2024-02-20 — End: 2024-02-20
  Administered 2024-02-20: 5000 [IU] via SUBCUTANEOUS
  Filled 2024-02-20: qty 1

## 2024-02-20 MED ORDER — 0.9 % SODIUM CHLORIDE (POUR BTL) OPTIME
TOPICAL | Status: DC | PRN
  Administered 2024-02-20: 1000 mL

## 2024-02-20 MED ORDER — OXYCODONE HCL 5 MG/5ML PO SOLN: 5.0000 mg | Freq: Once | ORAL | Status: AC | PRN

## 2024-02-20 MED ORDER — ONDANSETRON HCL 4 MG/2ML IJ SOLN
INTRAMUSCULAR | Status: DC | PRN
  Administered 2024-02-20: 4 mg via INTRAVENOUS

## 2024-02-20 MED ORDER — CELECOXIB 200 MG PO CAPS
200.0000 mg | ORAL_CAPSULE | Freq: Once | ORAL | Status: AC
  Administered 2024-02-20: 200 mg via ORAL
  Filled 2024-02-20: qty 1

## 2024-02-20 MED ORDER — DEXAMETHASONE SODIUM PHOSPHATE 10 MG/ML IJ SOLN
INTRAMUSCULAR | Status: AC
  Filled 2024-02-20: qty 1

## 2024-02-20 MED ORDER — SODIUM CHLORIDE 0.9 % IV SOLN
INTRAVENOUS | Status: DC | PRN
Start: 2024-02-20 — End: 2024-02-20

## 2024-02-20 MED ORDER — CHLORHEXIDINE GLUCONATE CLOTH 2 % EX PADS: 6.0000 | MEDICATED_PAD | Freq: Once | CUTANEOUS | Status: DC

## 2024-02-20 MED ORDER — FENTANYL CITRATE (PF) 250 MCG/5ML IJ SOLN
INTRAMUSCULAR | Status: DC | PRN
  Administered 2024-02-20: 100 ug via INTRAVENOUS
  Administered 2024-02-20: 50 ug via INTRAVENOUS

## 2024-02-20 MED ORDER — GABAPENTIN 300 MG PO CAPS
300.0000 mg | ORAL_CAPSULE | ORAL | Status: AC
  Administered 2024-02-20: 300 mg via ORAL
  Filled 2024-02-20: qty 1

## 2024-02-20 MED ORDER — LIDOCAINE 2% (20 MG/ML) 5 ML SYRINGE
INTRAMUSCULAR | Status: DC | PRN
Start: 2024-02-20 — End: 2024-02-20
  Administered 2024-02-20: 60 mg via INTRAVENOUS

## 2024-02-20 MED ORDER — ORAL CARE MOUTH RINSE: 15.0000 mL | Freq: Once | OROMUCOSAL | Status: AC

## 2024-02-20 MED ORDER — DEXMEDETOMIDINE HCL IN NACL 80 MCG/20ML IV SOLN
INTRAVENOUS | Status: DC | PRN
  Administered 2024-02-20 (×3): 4 ug via INTRAVENOUS

## 2024-02-20 MED ORDER — STERILE WATER FOR IRRIGATION IR SOLN
Status: DC | PRN
  Administered 2024-02-20: 1000 mL

## 2024-02-20 MED ORDER — FENTANYL CITRATE (PF) 100 MCG/2ML IJ SOLN
INTRAMUSCULAR | Status: AC
  Filled 2024-02-20: qty 2

## 2024-02-20 MED ORDER — MIDAZOLAM HCL 2 MG/2ML IJ SOLN
INTRAMUSCULAR | Status: AC
  Filled 2024-02-20: qty 2

## 2024-02-20 MED ORDER — LIDOCAINE 2% (20 MG/ML) 5 ML SYRINGE
INTRAMUSCULAR | Status: AC
  Filled 2024-02-20: qty 5

## 2024-02-20 MED ORDER — OXYCODONE HCL 5 MG PO TABS: 5.0000 mg | ORAL_TABLET | Freq: Four times a day (QID) | ORAL | 0 refills | Status: AC | PRN

## 2024-02-20 MED ORDER — PROPOFOL 10 MG/ML IV BOLUS
INTRAVENOUS | Status: AC
  Filled 2024-02-20: qty 20

## 2024-02-20 MED ORDER — MIDAZOLAM HCL 2 MG/2ML IJ SOLN
INTRAMUSCULAR | Status: DC | PRN
  Administered 2024-02-20: 2 mg via INTRAVENOUS

## 2024-02-20 MED ORDER — FENTANYL CITRATE (PF) 250 MCG/5ML IJ SOLN
INTRAMUSCULAR | Status: AC
  Filled 2024-02-20: qty 5

## 2024-02-20 MED ORDER — OXYCODONE HCL 5 MG PO TABS
5.0000 mg | ORAL_TABLET | Freq: Once | ORAL | Status: AC | PRN
  Administered 2024-02-20: 5 mg via ORAL

## 2024-02-20 MED ORDER — ACETAMINOPHEN 500 MG PO TABS: 1000.0000 mg | ORAL_TABLET | Freq: Once | ORAL | Status: DC

## 2024-02-20 MED ORDER — SODIUM CHLORIDE 0.9 % IV SOLN: 12.5000 mg | INTRAVENOUS | Status: DC | PRN

## 2024-02-20 MED ORDER — ACETAMINOPHEN 500 MG PO TABS
1000.0000 mg | ORAL_TABLET | ORAL | Status: AC
  Administered 2024-02-20: 1000 mg via ORAL
  Filled 2024-02-20: qty 2

## 2024-02-20 MED ORDER — OXYCODONE HCL 5 MG PO TABS
ORAL_TABLET | ORAL | Status: AC
  Filled 2024-02-20: qty 1

## 2024-02-20 MED ORDER — BUPIVACAINE-EPINEPHRINE (PF) 0.25% -1:200000 IJ SOLN
INTRAMUSCULAR | Status: AC
  Filled 2024-02-20: qty 30

## 2024-02-20 MED ORDER — CHLORHEXIDINE GLUCONATE 0.12 % MT SOLN
15.0000 mL | Freq: Once | OROMUCOSAL | Status: AC
  Administered 2024-02-20: 15 mL via OROMUCOSAL

## 2024-02-20 MED ORDER — FENTANYL CITRATE (PF) 100 MCG/2ML IJ SOLN
25.0000 ug | INTRAMUSCULAR | Status: DC | PRN
  Administered 2024-02-20: 25 ug via INTRAVENOUS
  Administered 2024-02-20: 50 ug via INTRAVENOUS
  Administered 2024-02-20: 25 ug via INTRAVENOUS
  Administered 2024-02-20: 50 ug via INTRAVENOUS

## 2024-02-20 MED ORDER — CEFAZOLIN SODIUM-DEXTROSE 2-4 GM/100ML-% IV SOLN
2.0000 g | INTRAVENOUS | Status: AC
  Administered 2024-02-20: 2 g via INTRAVENOUS
  Filled 2024-02-20: qty 100

## 2024-02-20 MED ORDER — BUPIVACAINE-EPINEPHRINE 0.25% -1:200000 IJ SOLN
INTRAMUSCULAR | Status: DC | PRN
  Administered 2024-02-20: 7 mL

## 2024-02-20 MED ORDER — INDOCYANINE GREEN 25 MG IV SOLR
2.5000 mg | Freq: Once | INTRAVENOUS | Status: AC
  Administered 2024-02-20: 2.5 mg via INTRAVENOUS
  Filled 2024-02-20: qty 10

## 2024-02-20 MED ORDER — LACTATED RINGERS IV SOLN: INTRAVENOUS | Status: DC

## 2024-02-20 MED ORDER — SODIUM CHLORIDE 0.9 % IR SOLN
Status: DC | PRN
  Administered 2024-02-20: 1000 mL

## 2024-02-20 SURGICAL SUPPLY — 45 items
BAG COUNTER SPONGE SURGICOUNT (BAG) ×2 IMPLANT
CANISTER SUCT 3000ML PPV (MISCELLANEOUS) ×2 IMPLANT
CHLORAPREP W/TINT 26 (MISCELLANEOUS) ×2 IMPLANT
CLIP LIGATING HEMO O LOK GREEN (MISCELLANEOUS) ×2 IMPLANT
CNTNR URN SCR LID CUP LEK RST (MISCELLANEOUS) ×2 IMPLANT
COVER MAYO STAND STRL (DRAPES) IMPLANT
COVER SURGICAL LIGHT HANDLE (MISCELLANEOUS) ×2 IMPLANT
DERMABOND ADVANCED .7 DNX12 (GAUZE/BANDAGES/DRESSINGS) ×2 IMPLANT
DRAPE C-ARM 42X120 X-RAY (DRAPES) IMPLANT
ELECT REM PT RETURN 9FT ADLT (ELECTROSURGICAL) ×1 IMPLANT
ELECTRODE REM PT RTRN 9FT ADLT (ELECTROSURGICAL) ×2 IMPLANT
GAUZE 4X4 16PLY ~~LOC~~+RFID DBL (SPONGE) ×2 IMPLANT
GLOVE BIOGEL PI MICRO STRL 6 (GLOVE) ×2 IMPLANT
GLOVE INDICATOR 6.5 STRL GRN (GLOVE) ×2 IMPLANT
GOWN STRL REUS W/ TWL LRG LVL3 (GOWN DISPOSABLE) ×2 IMPLANT
GRASPER SUT TROCAR 14GX15 (MISCELLANEOUS) ×2 IMPLANT
IRRIG SUCT STRYKERFLOW 2 WTIP (MISCELLANEOUS) ×1 IMPLANT
IRRIGATION SUCT STRKRFLW 2 WTP (MISCELLANEOUS) ×2 IMPLANT
KIT BASIN OR (CUSTOM PROCEDURE TRAY) ×2 IMPLANT
KIT IMAGING PINPOINTPAQ (MISCELLANEOUS) IMPLANT
KIT TURNOVER KIT B (KITS) ×2 IMPLANT
L-HOOK LAP DISP 36CM (ELECTROSURGICAL) ×1 IMPLANT
LHOOK LAP DISP 36CM (ELECTROSURGICAL) ×2 IMPLANT
NDL INSUFFLATION 14GA 120MM (NEEDLE) ×2 IMPLANT
NEEDLE INSUFFLATION 14GA 120MM (NEEDLE) ×1 IMPLANT
NS IRRIG 1000ML POUR BTL (IV SOLUTION) ×2 IMPLANT
PAD ARMBOARD 7.5X6 YLW CONV (MISCELLANEOUS) ×2 IMPLANT
PENCIL BUTTON HOLSTER BLD 10FT (ELECTRODE) ×2 IMPLANT
SCISSORS LAP 5X35 DISP (ENDOMECHANICALS) ×2 IMPLANT
SET CHOLANGIOGRAPH 5 50 .035 (SET/KITS/TRAYS/PACK) IMPLANT
SET TUBE SMOKE EVAC HIGH FLOW (TUBING) ×2 IMPLANT
SLEEVE Z-THREAD 12X100MM (TROCAR) IMPLANT
SLEEVE Z-THREAD 5X100MM (TROCAR) ×4 IMPLANT
SPONGE T-LAP 18X18 ~~LOC~~+RFID (SPONGE) ×2 IMPLANT
SUT MNCRL AB 4-0 PS2 18 (SUTURE) ×2 IMPLANT
SUT VICRYL 0 UR6 27IN ABS (SUTURE) IMPLANT
SYS BAG RETRIEVAL 10MM (BASKET) ×1 IMPLANT
SYSTEM BAG RETRIEVAL 10MM (BASKET) ×2 IMPLANT
TOWEL GREEN STERILE (TOWEL DISPOSABLE) ×2 IMPLANT
TOWEL GREEN STERILE FF (TOWEL DISPOSABLE) ×2 IMPLANT
TRAY LAPAROSCOPIC MC (CUSTOM PROCEDURE TRAY) ×2 IMPLANT
TROCAR Z THREAD OPTICAL 12X100 (TROCAR) ×2 IMPLANT
TROCAR Z-THREAD OPTICAL 5X100M (TROCAR) ×2 IMPLANT
WARMER LAPAROSCOPE (MISCELLANEOUS) ×2 IMPLANT
WATER STERILE IRR 1000ML POUR (IV SOLUTION) ×2 IMPLANT

## 2024-02-20 NOTE — H&P (Signed)
 Chief Complaint: RUQ abdominal pain History of Present Illness: [name] is a 38 y.o. male who was seen 2/18 as an office consultation for evaluation of cholelithiasis.  Patient started having abdominal pain 1.5 months ago  ago that was constant in nature in RUQ. Worsened with eating fatty and greasy foods. He was seen in ED and had RUQ on 2/11 that showed distended gallbladder with cholelithiasis and wall thickening with + sonographic Murphy's sign. Labs were not obtained at that time.   Patient states he has had 1 or 2 episodes of this in the remote past. Those episodes only lasted a few hours. This is the first episodes that has lasted for several weeks without relief.  Patient also has a fat containing umbilical hernia that causes pain. No obstructive symptoms. Patient will not let me reduce in clinic.  Of note I was concerned on clinic visit that he may have element of cholecystitis. Advised patient needed more urgent eval and likely cholecystectomy urgently. He went to ED but refused to wait and was adamant about outpatient procedure. We discussed that this increases risks of complications, damage to structures, bile leak, subtotal cholecystectomy and he still requested outpatient operation.  Review of Systems: A complete review of systems was obtained from the patient. I have reviewed this information and discussed as appropriate with the patient. ROS otherwise negative except as noted in HPI.  Medical History: History reviewed. No pertinent past medical history.  There is no problem list on file for this patient.  Past Surgical History:  Procedure Laterality Date  LEFT FOOT 2015    Allergies  Allergen Reactions  Other Other (See Comments)  "lacrilube" Caused redness   Current Outpatient Medications on File Prior to Visit  Medication Sig Dispense Refill  oxyCODONE-acetaminophen (PERCOCET) 5-325 mg tablet Take 1 tablet by mouth every 6 (six) hours as needed   No  current facility-administered medications on file prior to visit.   History reviewed. No pertinent family history.   Social History   Tobacco Use  Smoking Status Every Day  Types: Cigarettes  Smokeless Tobacco Never    Social History   Socioeconomic History  Marital status: Unknown  Tobacco Use  Smoking status: Every Day  Types: Cigarettes  Smokeless tobacco: Never  Vaping Use  Vaping status: Unknown  Substance and Sexual Activity  Alcohol use: Not Currently  Drug use: Never   Social Drivers of Health   Housing Stability: Unknown (01/28/2024)  Housing Stability Vital Sign  Homeless in the Last Year: No   Objective:   Vitals:  01/28/24 0850  BP: 110/76  Pulse: 76  Temp: 36.6 C (97.9 F)  SpO2: 98%  Weight: 100 kg (220 lb 6.4 oz)  Height: 170.2 cm (5\' 7" )  PainSc: 5   Body mass index is 34.52 kg/m.  Physical Exam: General: No acute distress, well appearing HEENT: PERRL, hearing grossly normal, mucous membranes moist CV: Regular rate and rhythm Pulm: Normal work of breathing on room air Abd: Soft, tender to palpation in RUQ, + Murphy's, mild tenderness in epigastrium, fat containing umbilical hernia, mildly tender to palpation Extremities: Warm and well perfused Neuro: A&O x4, no focal neurologic deficits Psych: Appropriate mood and effect  Labs, Imaging and Diagnostic Testing: I personally reviewed imaging and agree with radiologist's interpretation '[place] RUQ 01/21/24: Distended gallbladder with stones and wall thickening (4mm). Reported sonographic Murphy's sign.  Assessment and Plan:   Diagnoses and all orders for this visit:  Calculus of gallbladder with acute on  chronic cholecystitis without obstruction  Umbilical hernia without obstruction and without gangrene   Proceed to OR for laparoscopic cholecystectomy with ICG, possible IOC.  We discussed the etiology of patient's pain, we discussed treatment options and recommended surgery. We discussed  details of surgery including general anesthesia, laparoscopic approach, identification of cystic duct and common bile duct. Ligation of cystic duct and cystic artery. Possible need for intraoperative cholangiogram, open procedure, and subtotal cholecystectomy. Possible risks of common bile duct injury, injury to surrounding structures, bile leak, bleeding, infection, diarrhea, retained stone and hernia. I reiterated that the risk of injury to surrounding structures, bleeding, bile leak, infection, abscess and other of above complications were higher risk given prolonged symptoms and refusal to stay in ED for urgent evaluation. The patient showed good understanding and all questions were answered.   Donata Duff, MD [facility]

## 2024-02-20 NOTE — Anesthesia Postprocedure Evaluation (Signed)
 Anesthesia Post Note  Patient: [name]  Procedure(s) Performed: LAPAROSCOPIC CHOLECYSTECTOMY WITH ICG DYE (Abdomen) PRIMARY HERNIA REPAIR UMBILICAL ADULT (Abdomen)     Patient location during evaluation: PACU Anesthesia Type: General Level of consciousness: awake and alert Pain management: pain level controlled Vital Signs Assessment: post-procedure vital signs reviewed and stable Respiratory status: spontaneous breathing, nonlabored ventilation and respiratory function stable Cardiovascular status: stable and blood pressure returned to baseline Anesthetic complications: no   No notable events documented.  Last Vitals:  Vitals:   02/20/24 0945 02/20/24 1000  BP: 125/83 113/79  Pulse: 76 70  Resp: 15 14  Temp:  36.6 C  SpO2: 95% 98%                  [name]

## 2024-02-20 NOTE — Anesthesia Procedure Notes (Signed)
 Procedure Name: Intubation Date/Time: 02/20/2024 7:34 AM  Performed by: Susy Manor, CRNAPre-anesthesia Checklist: Patient identified, Emergency Drugs available, Suction available and Patient being monitored Patient Re-evaluated:Patient Re-evaluated prior to induction Oxygen Delivery Method: Circle System Utilized Preoxygenation: Pre-oxygenation with 100% oxygen Induction Type: IV induction Ventilation: Mask ventilation without difficulty Laryngoscope Size: Glidescope and 4 Grade View: Grade II Tube type: Oral Tube size: 7.5 mm Number of attempts: 1 Airway Equipment and Method: Stylet and Oral airway Placement Confirmation: ETT inserted through vocal cords under direct vision, positive ETCO2 and breath sounds checked- equal and bilateral Secured at: 22 cm Tube secured with: Tape Dental Injury: Teeth and Oropharynx as per pre-operative assessment

## 2024-02-20 NOTE — Transfer of Care (Signed)
 Immediate Anesthesia Transfer of Care Note  Patient: [name]  Procedure(s) Performed: LAPAROSCOPIC CHOLECYSTECTOMY WITH ICG DYE (Abdomen) PRIMARY HERNIA REPAIR UMBILICAL ADULT (Abdomen)  Patient Location: PACU  Anesthesia Type:General  Level of Consciousness: sedated  Airway & Oxygen Therapy: Patient Spontanous Breathing and Patient connected to nasal cannula oxygen  Post-op Assessment: Report given to RN and Post -op Vital signs reviewed and stable  Post vital signs: Reviewed and stable  Last Vitals:  Vitals Value Taken Time  BP 134/78 02/20/24 0915  Temp    Pulse 80 02/20/24 0917  Resp 18 02/20/24 0917  SpO2 100 % 02/20/24 0917  Vitals shown include unfiled device data.  Last Pain:  Vitals:   02/20/24 0622  TempSrc:   PainSc: 3          Complications: No notable events documented.

## 2024-02-20 NOTE — Op Note (Addendum)
 02/20/2024 9:07 AM  PATIENT: [name]  38 y.o. male  Patient Care Team: Tally Joe, MD as PCP - General (Family Medicine)  PRE-OPERATIVE DIAGNOSIS: cholelithiasis with chronic cholecystitis and incarcerated umbilical hernia  POST-OPERATIVE DIAGNOSIS: same  PROCEDURE: laparoscopic cholecystectomy with indocyanine green and primary repair of umbilical hernia  SURGEON: Donata Duff, MD  ASSISTANT: Feliciana Rossetti, MD  ANESTHESIA: General endotracheal  EBL: 5 cc  DRAINS: None  SPECIMEN: Gallbladder  COUNTS: Sponge, needle and instrument counts were reported correct x2 at the conclusion of the operation  DISPOSITION: PACU in satisfactory condition  COMPLICATIONS: None  FINDINGS: Chronically inflamed, distended gallbladder with several large stones throughout gallbladder. 1cm umbilical hernia with incarcerated peritoneal fat.  DESCRIPTION:  The patient was identified & brought into the operating room. He was then positioned supine on the OR table. SCDs were in place and active during the entire case. He then underwent general endotracheal anesthesia. Pressure points were padded. Hair on the abdomen was clipped by the OR team. The abdomen was prepped and draped in the standard sterile fashion. Antibiotics were administered. A surgical timeout was performed and confirmed our plan.   A periumbilical incision was made. Dissection was carried out to level of fascia and incarcerated peritoneal fat was identified. This was easily reduced into the abdominal cavity. 0 vicryl was placed on the fascia on either side and a Hasson trocar was inserted. A 30degree scoped then placed into the abdomen. A 5 mm supxiphoid trocar was placed under direct visualization and  two additional 5mm trocars were placed along the right subcostal line - one 5mm port in mid subcostal region, another 5mm port in the right flank near the anterior axillary line.  The liver and gallbladder were inspected. The  gallbladder was distended, thickened and appeared chronically inflamed. The gallbladder fundus was grasped and elevated cephalad.  An additional grasper was then placed on the infundibulum of the gallbladder and the infundibulum was retracted laterally. ICG was employed to ensure proper identification of biliary system given size/distension of gallbladder. No aberrant anatomy noted. Staying high on the gallbladder, the peritoneum on both sides of the gallbladder was opened with hook cautery. Gentle blunt dissection was then employed with a Art gallery manager working down into [employer]. The cystic duct was identified and carefully circumferentially dissected. The cystic artery was also identified and carefully circumferentially dissected. The space between the cystic artery and hepatocystic plate was developed such that a good view of the liver could be seen through a window medial to the cystic artery. The triangle of Calot had been cleared of all fibrofatty tissue. At this point, a critical view of safety was achieved and the only structures visualized was the skeletonized cystic duct laterally, the skeletonized cystic artery and the liver through the window medial to the artery. No posterior cystic artery was noted.  The cystic duct and artery were clipped with 2 hemolock clips on the patient side and 1 clip on the specimen side. The cystic duct and artery were then divided. The gallbladder was then freed from its remaining attachments to the liver using electrocautery and placed into an endocatch bag. The RUQ was gently irrigated with sterile saline. Hemostasis was then verified. The clips were in good position; the gallbladder fossa was dry. The rest of the abdomen was inspected no injury nor bleeding elsewhere was identified.  The endocatch bag containing the gallbladder was then removed from the periumbilical port site and passed off as specimen. The fascia of  the periumbilical incision had to be  extended in order to facilitate removal of the gallbladder, subsequently the initial sutures placed here were cut. The RUQ and subxiphoid ports were removed under direct visualization and noted to be hemostatic.. The periumbilical fascia was palpated and noted to be completely closed. The skin of all incision sites was approximated with 4-0 monocryl subcuticular suture and dermabond applied. The patient was then awakened from anesthesia, extubated, and transferred to a stretcher for transport to PACU in satisfactory condition.  Instrument, sponge, and needle counts were correct at closure and at the conclusion of the case.   Donata Duff, MD [facility]

## 2024-02-21 ENCOUNTER — Encounter (HOSPITAL_COMMUNITY): Payer: Self-pay | Admitting: General Surgery

## 2024-02-21 LAB — SURGICAL PATHOLOGY
